# Patient Record
Sex: Female | Born: 1940 | Race: Black or African American | Hispanic: No | Marital: Married | Smoking: Former smoker
Health system: Southern US, Community
[De-identification: ages and names within clinical notes are randomized; demographics above are authoritative.]

## PROBLEM LIST (undated history)

## (undated) DIAGNOSIS — E119 Type 2 diabetes mellitus without complications: Secondary | ICD-10-CM

## (undated) DIAGNOSIS — IMO0001 Reserved for inherently not codable concepts without codable children: Secondary | ICD-10-CM

## (undated) DIAGNOSIS — I10 Essential (primary) hypertension: Secondary | ICD-10-CM

## (undated) DIAGNOSIS — K219 Gastro-esophageal reflux disease without esophagitis: Secondary | ICD-10-CM

## (undated) DIAGNOSIS — J449 Chronic obstructive pulmonary disease, unspecified: Secondary | ICD-10-CM

## (undated) DIAGNOSIS — J45909 Unspecified asthma, uncomplicated: Secondary | ICD-10-CM

## (undated) DIAGNOSIS — D649 Anemia, unspecified: Secondary | ICD-10-CM

## (undated) DIAGNOSIS — F329 Major depressive disorder, single episode, unspecified: Secondary | ICD-10-CM

## (undated) DIAGNOSIS — E785 Hyperlipidemia, unspecified: Secondary | ICD-10-CM

## (undated) DIAGNOSIS — R32 Unspecified urinary incontinence: Secondary | ICD-10-CM

## (undated) HISTORY — DX: Type 2 diabetes mellitus without complications: E11.9

## (undated) HISTORY — DX: Reserved for inherently not codable concepts without codable children: IMO0001

## (undated) HISTORY — DX: Essential (primary) hypertension: I10

## (undated) HISTORY — DX: Gastro-esophageal reflux disease without esophagitis: K21.9

## (undated) HISTORY — DX: Anemia, unspecified: D64.9

## (undated) HISTORY — DX: Unspecified urinary incontinence: R32

## (undated) HISTORY — PX: BREAST BIOPSY: SHX20

## (undated) HISTORY — DX: Major depressive disorder, single episode, unspecified: F32.9

## (undated) HISTORY — DX: Chronic obstructive pulmonary disease, unspecified: J44.9

## (undated) HISTORY — DX: Hyperlipidemia, unspecified: E78.5

---

## 1967-07-08 HISTORY — PX: BLADDER SURGERY: SHX569

## 1971-07-08 HISTORY — PX: BREAST SURGERY: SHX581

## 2007-07-14 ENCOUNTER — Ambulatory Visit: Payer: Self-pay | Admitting: Internal Medicine

## 2007-10-08 ENCOUNTER — Ambulatory Visit: Payer: Self-pay | Admitting: Unknown Physician Specialty

## 2008-08-15 ENCOUNTER — Ambulatory Visit: Payer: Self-pay | Admitting: Internal Medicine

## 2009-12-13 ENCOUNTER — Ambulatory Visit: Payer: Self-pay | Admitting: Family Medicine

## 2015-06-20 ENCOUNTER — Encounter: Payer: Self-pay | Admitting: Obstetrics and Gynecology

## 2015-06-20 ENCOUNTER — Ambulatory Visit (INDEPENDENT_AMBULATORY_CARE_PROVIDER_SITE_OTHER): Payer: Medicare Other | Admitting: Obstetrics and Gynecology

## 2015-06-20 VITALS — BP 144/81 | HR 106 | Resp 16 | Ht 65.0 in | Wt 154.6 lb

## 2015-06-20 DIAGNOSIS — J449 Chronic obstructive pulmonary disease, unspecified: Secondary | ICD-10-CM | POA: Insufficient documentation

## 2015-06-20 DIAGNOSIS — E119 Type 2 diabetes mellitus without complications: Secondary | ICD-10-CM | POA: Diagnosis not present

## 2015-06-20 DIAGNOSIS — D649 Anemia, unspecified: Secondary | ICD-10-CM | POA: Insufficient documentation

## 2015-06-20 DIAGNOSIS — R310 Gross hematuria: Secondary | ICD-10-CM | POA: Diagnosis not present

## 2015-06-20 DIAGNOSIS — R32 Unspecified urinary incontinence: Secondary | ICD-10-CM | POA: Insufficient documentation

## 2015-06-20 DIAGNOSIS — K219 Gastro-esophageal reflux disease without esophagitis: Secondary | ICD-10-CM

## 2015-06-20 DIAGNOSIS — IMO0001 Reserved for inherently not codable concepts without codable children: Secondary | ICD-10-CM | POA: Insufficient documentation

## 2015-06-20 DIAGNOSIS — E785 Hyperlipidemia, unspecified: Secondary | ICD-10-CM | POA: Insufficient documentation

## 2015-06-20 DIAGNOSIS — F329 Major depressive disorder, single episode, unspecified: Secondary | ICD-10-CM | POA: Insufficient documentation

## 2015-06-20 DIAGNOSIS — I10 Essential (primary) hypertension: Secondary | ICD-10-CM | POA: Insufficient documentation

## 2015-06-20 LAB — URINALYSIS, COMPLETE
Bilirubin, UA: NEGATIVE
GLUCOSE, UA: NEGATIVE
Ketones, UA: NEGATIVE
NITRITE UA: NEGATIVE
PROTEIN UA: NEGATIVE
RBC, UA: NEGATIVE
Specific Gravity, UA: 1.02 (ref 1.005–1.030)
Urobilinogen, Ur: 0.2 mg/dL (ref 0.2–1.0)
pH, UA: 5 (ref 5.0–7.5)

## 2015-06-20 LAB — BLADDER SCAN AMB NON-IMAGING

## 2015-06-20 LAB — MICROSCOPIC EXAMINATION: RBC, UA: NONE SEEN /hpf (ref 0–?)

## 2015-06-20 NOTE — Progress Notes (Signed)
06/20/2015 11:48 AM   Shelly Parks 04/05/1941 [id number]  Referring provider: No referring provider defined for this encounter.  Chief Complaint  Patient presents with  . Urinary Incontinence  . Establish Care    HPI: Patient is a 74 year old female presenting today as a referral from her primary care provider with complaints of urinary frequency, nocturia, occasional urge incontinence, weak stream, difficulty urinating and the patient report of one episode of gross hematuria. No exacerbating or alleviating factors.  Patient is a very pleasant female but does seem to have some baseline memory issues. She states that she cannot recall how long she has been experiencing her urinary symptoms. She does state that she saw blood in her urine but is not quite sure how many times. She states that it occurred a few weeks ago and it has since cleared.  She reports her most bothersome symptom is nocturia with 4-5 voids per night.  She denies any history of renal stones.  Last Cr 0.92  She is a diabetic but per her referral notes it seems that her blood glucoses well controlled.   PMH: Past Medical History  Diagnosis Date  . HLD (hyperlipidemia)   . HTN (hypertension), benign   . Anemia   . Major depressive disorder, single episode, unspecified (HCC)   . Reflux   . COPD (chronic obstructive pulmonary disease) (HCC)   . Incontinence of urine in female   . Diabetes mellitus without complication [facility](HCC)     Surgical History: Past Surgical History  Procedure Laterality Date  . Breast surgery  1973  . Bladder surgery  1969    Home Medications:    Medication List       This list is accurate as of: 06/20/15 11:48 AM.  Always use your most recent med list.               ADVAIR DISKUS 100-50 MCG/DOSE Aepb  Generic drug:  Fluticasone-Salmeterol  Inhale 1 puff into the lungs 2 (two) times daily.     albuterol 108 (90 BASE) MCG/ACT inhaler  Commonly known as:  PROVENTIL  HFA;VENTOLIN HFA  Inhale into the lungs every 6 (six) hours as needed for wheezing or shortness of breath.     amLODipine 10 MG tablet  Commonly known as:  NORVASC  Take 10 mg by mouth daily.     CALCIUM 500 PO  Take 1 tablet by mouth.     hydrochlorothiazide 25 MG tablet  Commonly known as:  HYDRODIURIL  Take 25 mg by mouth daily.     lisinopril 40 MG tablet  Commonly known as:  PRINIVIL,ZESTRIL  Take 40 mg by mouth daily.     omeprazole 40 MG capsule  Commonly known as:  PRILOSEC  Take 40 mg by mouth daily.     pravastatin 80 MG tablet  Commonly known as:  PRAVACHOL  Take 80 mg by mouth daily.     venlafaxine XR 37.5 MG 24 hr capsule  Commonly known as:  EFFEXOR-XR  Take 37.5 mg by mouth daily with breakfast.     VITRON-C PO  Take 1 tablet by mouth.        Allergies: No Known Allergies  Family History: Family History  Problem Relation Age of Onset  . Cancer Father   . Hypertension Mother   . Cancer Brother   . Hypertension Sister   . Bladder Cancer Sister     Social History:  reports that she quit smoking about 45 years ago. She  does not have any smokeless tobacco history on file. She reports that she does not drink alcohol or use illicit drugs.  ROS: UROLOGY Frequent Urination?: Yes Hard to postpone urination?: No Burning/pain with urination?: No Get up at night to urinate?: Yes Leakage of urine?: Yes Urine stream starts and stops?: Yes Trouble starting stream?: Yes Do you have to strain to urinate?: Yes Blood in urine?: Yes Urinary tract infection?: No Sexually transmitted disease?: No Injury to kidneys or bladder?: No Painful intercourse?: No Weak stream?: Yes Currently pregnant?: No Vaginal bleeding?: No Last menstrual period?: n/a  Gastrointestinal Nausea?: Yes Vomiting?: No Indigestion/heartburn?: Yes Diarrhea?: No Constipation?: Yes  Constitutional Fever: No Night sweats?: Yes Weight loss?: Yes Fatigue?: Yes  Skin Skin  rash/lesions?: Yes Itching?: Yes  Eyes Blurred vision?: Yes Double vision?: No  Ears/Nose/Throat Sore throat?: Yes Sinus problems?: Yes  Hematologic/Lymphatic Swollen glands?: No Easy bruising?: Yes  Cardiovascular Leg swelling?: Yes Chest pain?: Yes  Respiratory Cough?: Yes Shortness of breath?: Yes  Endocrine Excessive thirst?: Yes  Musculoskeletal Back pain?: Yes Joint pain?: Yes  Neurological Headaches?: No Dizziness?: Yes  Psychologic Depression?: No Anxiety?: Yes  Physical Exam: BP 144/81 mmHg  Pulse 106  Resp 16  Ht  (1.651 m)  Wt 154 lb 9.6 oz (70.126 kg)  BMI 25.73 kg/m2  Constitutional:  Alert and oriented, No acute distress. HEENT: [place] AT, moist mucus membranes.  Trachea midline, no masses. Cardiovascular: No clubbing, cyanosis, or edema. Respiratory: Normal respiratory effort, no increased work of breathing. GI: Abdomen is soft, nontender, nondistended, no abdominal masses GU: No CVA tenderness.  Normal external genitalia, Mild vaginal atrophy, cervix and uterus surgically absent, normal urethral meatus, grade 1 cystocele, no SUI with Valsalva Skin: No rashes, bruises or suspicious lesions. Lymph: No cervical or inguinal adenopathy. Neurologic: Grossly intact, no focal deficits, moving all 4 extremities. Psychiatric: Normal mood and affect.  Laboratory Data:   Urinalysis Results for orders placed or performed in visit on 06/20/15  BLADDER SCAN AMB NON-IMAGING  Result Value Ref Range   Scan Result 27 mL     Pertinent Imaging:   Assessment & Plan:   1. Incontinence, urge- Minimal PVR 27 mLs. Patient provided samples of Myrbetriq 25 mg 1 tab daily to try for treatment of her urinary frequency and nocturia while hematuria workup pending.  - Urinalysis, Complete - BLADDER SCAN AMB NON-IMAGING  2. Gross Hematuria-  UA unremarkable today. Patient recalls at least one episode of gross hematuria which she states has cleared now. We  discussed the differential diagnosis for microscopic hematuria including nephrolithiasis, renal or upper tract tumors, bladder stones, UTIs, or bladder tumors as well as undetermined etiologies. Per AUA guidelines, I did recommend complete microscopic hematuria evaluation including CTU, possible urine cytology, and office cystoscopy. -CT Urogram -cystoscopy  3. Diabetes mellitus without complication (HCC)-well-controlled continue management by PCP.   Return for CT URgram results and cystoscopy.  These notes generated with voice recognition software. I apologize for typographical errors.  Earlie Lou, FNP  [facility] [address] [place], [place] [address] [phone]

## 2015-06-20 NOTE — Patient Instructions (Addendum)
You mentioned today that you have seen blood in your urine.  This is called gross hematuria.  We need to perform tests including a CT scan and a cystoscopy.  Someone will be calling you to schedule your CT scan.  Your cystoscopy will be performed here at our office and we will also review your CT results with you at that time.  You were provided samples of Myrbetriq today to help with your urinary frequency.  Please take one tablet daily.  Notify our office if you experience any difficulty urinating.  Hematuria, Adult Hematuria is blood in your urine. It can be caused by a bladder infection, kidney infection, prostate infection, kidney stone, or cancer of your urinary tract. Infections can usually be treated with medicine, and a kidney stone usually will pass through your urine. If neither of these is the cause of your hematuria, further workup to find out the reason may be needed. It is very important that you tell your health care provider about any blood you see in your urine, even if the blood stops without treatment or happens without causing pain. Blood in your urine that happens and then stops and then happens again can be a symptom of a very serious condition. Also, pain is not a symptom in the initial stages of many urinary cancers. HOME CARE INSTRUCTIONS   Drink lots of fluid, 3-4 quarts a day. If you have been diagnosed with an infection, cranberry juice is especially recommended, in addition to large amounts of water.  Avoid caffeine, tea, and carbonated beverages because they tend to irritate the bladder.  Avoid alcohol because it may irritate the prostate.  Take all medicines as directed by your health care provider.  If you were prescribed an antibiotic medicine, finish it all even if you start to feel better.  If you have been diagnosed with a kidney stone, follow your health care provider's instructions regarding straining your urine to catch the stone.  Empty your bladder  often. Avoid holding urine for long periods of time.  After a bowel movement, women should cleanse front to back. Use each tissue only once.  Empty your bladder before and after sexual intercourse if you are a female. SEEK MEDICAL CARE IF:  You develop back pain.  You have a fever.  You have a feeling of sickness in your stomach (nausea) or vomiting.  Your symptoms are not better in 3 days. Return sooner if you are getting worse. SEEK IMMEDIATE MEDICAL CARE IF:   You develop severe vomiting and are unable to keep the medicine down.  You develop severe back or abdominal pain despite taking your medicines.  You begin passing a large amount of blood or clots in your urine.  You feel extremely weak or faint, or you pass out. MAKE SURE YOU:   Understand these instructions.  Will watch your condition.  Will get help right away if you are not doing well or get worse.   This information is not intended to replace advice given to you by your health care provider. Make sure you discuss any questions you have with your health care provider.   Document Released: 06/23/2005 Document Revised: 07/14/2014 Document Reviewed: 02/21/2013 Elsevier Interactive Patient Education [employer] Cystoscopy Cystoscopy is a procedure that is used to help your caregiver diagnose and sometimes treat conditions that affect your lower urinary tract. Your lower urinary tract includes your bladder and the tube through which urine passes from your bladder out of your body (  urethra). Cystoscopy is performed with a thin, tube-shaped instrument (cystoscope). The cystoscope has lenses and a light at the end so that your caregiver can see inside your bladder. The cystoscope is inserted at the entrance of your urethra. Your caregiver guides it through your urethra and into your bladder. There are two main types of cystoscopy:  Flexible cystoscopy (with a flexible cystoscope).  Rigid cystoscopy (with a rigid  cystoscope). Cystoscopy may be recommended for many conditions, including:  Urinary tract infections.  Blood in your urine (hematuria).  Loss of bladder control (urinary incontinence) or overactive bladder.  Unusual cells found in a urine sample.  Urinary blockage.  Painful urination. Cystoscopy may also be done to remove a sample of your tissue to be checked under a microscope (biopsy). It may also be done to remove or destroy bladder stones. LET YOUR CAREGIVER KNOW ABOUT:  Allergies to food or medicine.  Medicines taken, including vitamins, herbs, eyedrops, over-the-counter medicines, and creams.  Use of steroids (by mouth or creams).  Previous problems with anesthetics or numbing medicines.  History of bleeding problems or blood clots.  Previous surgery.  Other health problems, including diabetes and kidney problems.  Possibility of pregnancy, if this applies. PROCEDURE The area around the opening to your urethra will be cleaned. A medicine to numb your urethra (local anesthetic) is used. If a tissue sample or stone is removed during the procedure, you may be given a medicine to make you sleep (general anesthetic). Your caregiver will gently insert the tip of the cystoscope into your urethra. The cystoscope will be slowly glided through your urethra and into your bladder. Sterile fluid will flow through the cystoscope and into your bladder. The fluid will expand and stretch your bladder. This gives your caregiver a better view of your bladder walls. The procedure lasts about 15-20 minutes. AFTER THE PROCEDURE If a local anesthetic is used, you will be allowed to go home as soon as you are ready. If a general anesthetic is used, you will be taken to a recovery area until you are stable. You may have temporary bleeding and burning on urination.   This information is not intended to replace advice given to you by your health care provider. Make sure you discuss any questions you  have with your health care provider.   Document Released: 06/20/2000 Document Revised: 07/14/2014 Document Reviewed: 12/15/2011 Elsevier Interactive Patient Education [employer]

## 2015-06-29 ENCOUNTER — Ambulatory Visit
Admission: RE | Admit: 2015-06-29 | Discharge: 2015-06-29 | Disposition: A | Discharge status: Home / Self Care | Payer: Medicare Other | Source: Ambulatory Visit | Attending: Obstetrics and Gynecology | Admitting: Obstetrics and Gynecology

## 2015-06-29 DIAGNOSIS — R310 Gross hematuria: Secondary | ICD-10-CM | POA: Diagnosis not present

## 2015-06-29 MED ORDER — IOHEXOL 300 MG/ML  SOLN
125.0000 mL | Freq: Once | INTRAMUSCULAR | Status: AC | PRN
  Administered 2015-06-29: 125 mL via INTRAVENOUS

## 2015-07-04 ENCOUNTER — Encounter: Payer: Self-pay | Admitting: Urology

## 2015-07-04 ENCOUNTER — Ambulatory Visit: Payer: Medicare Other | Admitting: Obstetrics and Gynecology

## 2015-07-04 ENCOUNTER — Ambulatory Visit (INDEPENDENT_AMBULATORY_CARE_PROVIDER_SITE_OTHER): Payer: Medicare Other | Admitting: Urology

## 2015-07-04 VITALS — BP 145/82 | HR 88 | Ht 64.0 in | Wt 153.9 lb

## 2015-07-04 DIAGNOSIS — R310 Gross hematuria: Secondary | ICD-10-CM

## 2015-07-04 DIAGNOSIS — N3941 Urge incontinence: Secondary | ICD-10-CM | POA: Diagnosis not present

## 2015-07-04 LAB — URINALYSIS, COMPLETE
Bilirubin, UA: NEGATIVE
GLUCOSE, UA: NEGATIVE
KETONES UA: NEGATIVE
NITRITE UA: NEGATIVE
Protein, UA: NEGATIVE
RBC, UA: NEGATIVE
Specific Gravity, UA: 1.015 (ref 1.005–1.030)
UUROB: 0.2 mg/dL (ref 0.2–1.0)
pH, UA: 7 (ref 5.0–7.5)

## 2015-07-04 LAB — MICROSCOPIC EXAMINATION: Renal Epithel, UA: NONE SEEN /hpf

## 2015-07-04 MED ORDER — CIPROFLOXACIN HCL 500 MG PO TABS
500.0000 mg | ORAL_TABLET | Freq: Once | ORAL | Status: AC
  Administered 2015-07-04: 500 mg via ORAL

## 2015-07-04 MED ORDER — LIDOCAINE HCL 2 % EX GEL
1.0000 "application " | Freq: Once | CUTANEOUS | Status: AC
  Administered 2015-07-04: 1 via URETHRAL

## 2015-07-04 MED ORDER — MIRABEGRON ER 25 MG PO TB24
25.0000 mg | ORAL_TABLET | Freq: Every day | ORAL | Status: DC
Start: 2015-07-04 — End: 2016-10-30

## 2015-07-04 NOTE — Progress Notes (Signed)
07/04/2015 3:34 PM   Shelly Parks 06-Mar-1941 [id number]  Referring provider: No referring provider defined for this encounter.  Chief Complaint  Patient presents with  . Cysto    hematuria    HPI: Patient is a 73 year old female presenting today as a referral from her primary care provider with complaints of urinary frequency, nocturia, occasional urge incontinence, weak stream, difficulty urinating and the patient report of one episode of gross hematuria. No exacerbating or alleviating factors.  Patient is a very pleasant female but does seem to have some baseline memory issues. She states that she cannot recall how long she has been experiencing her urinary symptoms. She does state that she saw blood in her urine but is not quite sure how many times. She states that it occurred a few weeks ago and it has since cleared.  She reports her most bothersome symptom is nocturia with 4-5 voids per night.  She denies any history of renal stones.  Last Cr 0.92  She is a diabetic but per her referral notes it seems that her blood glucoses well controlled.   Interval History: The patient returns for completion of her hematuria workup. CT urogram was unremarkable. She presents for cystoscopy today.   In regards to her urge incontinence, the patient feels much better. Her nocturia is now minimal. She denies any current daytime frequency. She has had no more episodes of urge incontinence. She is very happy with the Myrbetriq.   PMH: Past Medical History  Diagnosis Date  . HLD (hyperlipidemia)   . HTN (hypertension), benign   . Anemia   . Major depressive disorder, single episode, unspecified (HCC)   . Reflux   . COPD (chronic obstructive pulmonary disease) (HCC)   . Incontinence of urine in female   . Diabetes mellitus without complication [facility])     Surgical History: Past Surgical History  Procedure Laterality Date  . Breast surgery  1973  . Bladder surgery  1969    Home  Medications:    Medication List       This list is accurate as of: 07/04/15  3:34 PM.  Always use your most recent med list.               ADVAIR DISKUS 100-50 MCG/DOSE Aepb  Generic drug:  Fluticasone-Salmeterol  Inhale 1 puff into the lungs 2 (two) times daily.     albuterol 108 (90 Base) MCG/ACT inhaler  Commonly known as:  PROVENTIL HFA;VENTOLIN HFA  Inhale into the lungs every 6 (six) hours as needed for wheezing or shortness of breath.     amLODipine 10 MG tablet  Commonly known as:  NORVASC  Take 10 mg by mouth daily.     CALCIUM 500 PO  Take 1 tablet by mouth.     hydrochlorothiazide 25 MG tablet  Commonly known as:  HYDRODIURIL  Take 25 mg by mouth daily.     lisinopril 40 MG tablet  Commonly known as:  PRINIVIL,ZESTRIL  Take 40 mg by mouth daily.     MYRBETRIQ 25 MG Tb24 tablet  Generic drug:  mirabegron ER  Take 25 mg by mouth daily.     omeprazole 40 MG capsule  Commonly known as:  PRILOSEC  Take 40 mg by mouth daily.     pravastatin 80 MG tablet  Commonly known as:  PRAVACHOL  Take 80 mg by mouth daily.     venlafaxine XR 37.5 MG 24 hr capsule  Commonly known as:  EFFEXOR-XR  Take 37.5 mg by mouth daily with breakfast.     VITRON-C PO  Take 1 tablet by mouth.        Allergies: No Known Allergies  Family History: Family History  Problem Relation Age of Onset  . Cancer Father   . Hypertension Mother   . Cancer Brother   . Hypertension Sister   . Bladder Cancer Sister     Social History:  reports that she quit smoking about 45 years ago. She does not have any smokeless tobacco history on file. She reports that she does not drink alcohol or use illicit drugs.  ROS:                                        Physical Exam: BP 145/82 mmHg  Pulse 88  Ht 5\' 4"  (1.626 m)  Wt 153 lb 14.4 oz (69.809 kg)  BMI 26.40 kg/m2  Constitutional:  Alert and oriented, No acute distress. HEENT: [place] AT, moist mucus membranes.   Trachea midline, no masses. Cardiovascular: No clubbing, cyanosis, or edema. Respiratory: Normal respiratory effort, no increased work of breathing. GI: Abdomen is soft, nontender, nondistended, no abdominal masses GU: No CVA tenderness.  Skin: No rashes, bruises or suspicious lesions. Lymph: No cervical or inguinal adenopathy. Neurologic: Grossly intact, no focal deficits, moving all 4 extremities. Psychiatric: Normal mood and affect.  Laboratory Data: No results found for: WBC, HGB, HCT, MCV, PLT  No results found for: CREATININE  No results found for: PSA  No results found for: TESTOSTERONE  No results found for: HGBA1C  Urinalysis    Component Value Date/Time   GLUCOSEU Negative 06/20/2015 0955   BILIRUBINUR Negative 06/20/2015 0955   NITRITE Negative 06/20/2015 0955   LEUKOCYTESUR Trace* 06/20/2015 0955    Pertinent Imaging:  Study Result     CLINICAL DATA: Painless hematuria for 1 month.  EXAM: CT ABDOMEN AND PELVIS WITHOUT AND WITH CONTRAST  TECHNIQUE: Multidetector CT imaging of the abdomen and pelvis was performed following the standard protocol before and following the bolus administration of intravenous contrast.  CONTRAST: 125mL OMNIPAQUE IOHEXOL 300 MG/ML SOLN  COMPARISON: None.  FINDINGS: Lower chest: Lung bases are clear.  Hepatobiliary: No focal hepatic lesion. No biliary duct dilatation. Gallbladder is normal. Common bile duct is normal.  Pancreas: Pancreas is normal. No ductal dilatation. No pancreatic inflammation.  Spleen: Normal spleen  Adrenals/urinary tract: Adrenal glands are normal.  Non IV contrast images demonstrate no nephrolithiasis or ureterolithiasis. Cortical phase imaging demonstrates no enhancing renal cortical lesion. Nonenhancing cyst lower pole of the LEFT kidney measures 16 mm. Delayed pyelogram phase imaging demonstrates no filling defects within the collecting systems or ureters. There is a mild  bulbous distention of the most distal RIGHT ureter (image 73, series 12).  No bladder calculi, filling defect within the bladder, or enhancing bladder lesion.  Stomach/Bowel: Stomach, small bowel, appendix, and cecum are normal. The colon and rectosigmoid colon are normal.  Vascular/Lymphatic: Abdominal aorta is normal caliber. There is no retroperitoneal or periportal lymphadenopathy. No pelvic lymphadenopathy.  Reproductive: Post hysterectomy anatomy.  Other: No free fluid.  Musculoskeletal: Some degenerate sclerosis along the SI joints. No aggressive osseous lesion.  IMPRESSION: 1. No explanation for hematuria. No nephrolithiasis, ureterolithiasis, enhancing renal cortical lesion, or filling defects within the collecting systems. 2. 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 3. Potential small ureterocele of the distal RIGHT  ureter. 4. No bladder stones or filling defects in the bladder which does not excluded a bladder lesion.    Cystoscopy Procedure Note  Patient identification was confirmed, informed consent was obtained, and patient was prepped using Betadine solution.  Lidocaine jelly was administered per urethral meatus.    Preoperative abx where received prior to procedure.    Procedure: - Flexible cystoscope introduced, without any difficulty.   - Thorough search of the bladder revealed:    normal urethral meatus    normal urothelium    no stones    no ulcers     no tumors    no urethral polyps    no trabeculation  - Ureteral orifices were normal in position and appearance.  Post-Procedure: - Patient tolerated the procedure well   Assessment & Plan:    1. Gross hematuria Benign work up. Repeat urinalysis for one year  2. Left Bosniak 1 renal cyst Benign lesion. No further work up required.  3.  Urge incontinence Continue Myrbetriq 25 mg daily. Follow up in 3 months  Return in about 3 months (around 10/02/2015).  Hildred Laser,  MD  [facility] [address] [place], [place] [address] [phone]

## 2015-10-03 ENCOUNTER — Ambulatory Visit: Payer: Medicare Other

## 2015-10-04 ENCOUNTER — Encounter: Payer: Self-pay | Admitting: Urology

## 2015-10-04 ENCOUNTER — Ambulatory Visit (INDEPENDENT_AMBULATORY_CARE_PROVIDER_SITE_OTHER): Payer: Medicare Other | Admitting: Urology

## 2015-10-04 VITALS — BP 159/80 | HR 103 | Ht 64.0 in | Wt 158.0 lb

## 2015-10-04 DIAGNOSIS — R310 Gross hematuria: Secondary | ICD-10-CM | POA: Diagnosis not present

## 2015-10-04 DIAGNOSIS — R32 Unspecified urinary incontinence: Secondary | ICD-10-CM | POA: Diagnosis not present

## 2015-10-04 LAB — URINALYSIS, COMPLETE
BILIRUBIN UA: NEGATIVE
GLUCOSE, UA: NEGATIVE
KETONES UA: NEGATIVE
NITRITE UA: NEGATIVE
Protein, UA: NEGATIVE
RBC UA: NEGATIVE
SPEC GRAV UA: 1.02 (ref 1.005–1.030)
Urobilinogen, Ur: 0.2 mg/dL (ref 0.2–1.0)
pH, UA: 5.5 (ref 5.0–7.5)

## 2015-10-04 LAB — MICROSCOPIC EXAMINATION
Epithelial Cells (non renal): >10 /hpf — ABNORMAL HIGH (ref 0–10)
RBC, UA: NONE SEEN /hpf (ref 0–?)

## 2015-10-04 LAB — BLADDER SCAN AMB NON-IMAGING: Scan Result: 0

## 2015-10-04 MED ORDER — MIRABEGRON ER 25 MG PO TB24: 25.0000 mg | ORAL_TABLET | Freq: Every day | ORAL | Status: DC

## 2015-10-04 NOTE — Progress Notes (Signed)
Bladder Scan °Patient  void: 0 ml °Performed By:K.Bricen Victory,CMA °

## 2015-10-04 NOTE — Progress Notes (Signed)
10/04/2015 11:35 AM   Shelly MantisIrene H Parks 06/08/1941 [id number]  Referring provider: No referring provider defined for this encounter.  Chief Complaint  Patient presents with  . Follow-up    urge incontinence    HPI: Patient is a 75 year old female presenting today as a referral from her primary care provider with complaints of urinary frequency, nocturia, occasional urge incontinence, weak stream, difficulty urinating and the patient report of one episode of gross hematuria. No exacerbating or alleviating factors.  Patient is a very pleasant female but does seem to have some baseline memory issues. She states that she cannot recall how long she has been experiencing her urinary symptoms. She does state that she saw blood in her urine but is not quite sure how many times. She states that it occurred a few weeks ago and it has since cleared.  She reports her most bothersome symptom is nocturia with 4-5 voids per night.  She denies any history of renal stones.  Last Cr 0.92  She is a diabetic but per her referral notes it seems that her blood glucoses well controlled.   The patient also under a gross hematuria workup with negative cystoscopy and CT urogram approximately 3 months ago.  In regards to her urge incontinence, the patient feels much better. Her nocturia is now minimal. She denies any current daytime frequency. She has had no more episodes of urge incontinence. She is very happy with the Myrbetriq. She is able to afford this medication with her insurance.   PVR: 0 cc  PMH: Past Medical History  Diagnosis Date  . HLD (hyperlipidemia)   . HTN (hypertension), benign   . Anemia   . Major depressive disorder, single episode, unspecified (HCC)   . Reflux   . COPD (chronic obstructive pulmonary disease) (HCC)   . Incontinence of urine in female   . Diabetes mellitus without complication [facility](HCC)     Surgical History: Past Surgical History  Procedure Laterality Date  .  Breast surgery  1973  . Bladder surgery  1969    Home Medications:    Medication List       This list is accurate as of: 10/04/15 11:35 AM.  Always use your most recent med list.               ADVAIR DISKUS 100-50 MCG/DOSE Aepb  Generic drug:  Fluticasone-Salmeterol  Inhale 1 puff into the lungs 2 (two) times daily.     albuterol 108 (90 Base) MCG/ACT inhaler  Commonly known as:  PROVENTIL HFA;VENTOLIN HFA  Inhale into the lungs every 6 (six) hours as needed for wheezing or shortness of breath.     amLODipine 10 MG tablet  Commonly known as:  NORVASC  Take 10 mg by mouth daily.     CALCIUM 500 PO  Take 1 tablet by mouth.     hydrochlorothiazide 25 MG tablet  Commonly known as:  HYDRODIURIL  Take 25 mg by mouth daily.     lisinopril 40 MG tablet  Commonly known as:  PRINIVIL,ZESTRIL  Take 40 mg by mouth daily.     MYRBETRIQ 25 MG Tb24 tablet  Generic drug:  mirabegron ER  Take 25 mg by mouth daily.     mirabegron ER 25 MG Tb24 tablet  Commonly known as:  MYRBETRIQ  Take 1 tablet (25 mg total) by mouth daily.     mirabegron ER 25 MG Tb24 tablet  Commonly known as:  MYRBETRIQ  Take 1 tablet (25 mg  total) by mouth daily.     omeprazole 40 MG capsule  Commonly known as:  PRILOSEC  Take 40 mg by mouth daily.     pravastatin 40 MG tablet  Commonly known as:  PRAVACHOL     venlafaxine XR 37.5 MG 24 hr capsule  Commonly known as:  EFFEXOR-XR  Take 37.5 mg by mouth daily with breakfast.     VITRON-C PO  Take 1 tablet by mouth.        Allergies: No Known Allergies  Family History: Family History  Problem Relation Age of Onset  . Cancer Father   . Hypertension Mother   . Cancer Brother   . Hypertension Sister   . Bladder Cancer Sister     Social History:  reports that she quit smoking about 45 years ago. She does not have any smokeless tobacco history on file. She reports that she does not drink alcohol or use illicit drugs.  ROS: UROLOGY Frequent  Urination?: Yes Hard to postpone urination?: Yes Burning/pain with urination?: No Get up at night to urinate?: Yes Leakage of urine?: No Urine stream starts and stops?: No Trouble starting stream?: No Do you have to strain to urinate?: No Blood in urine?: No Urinary tract infection?: No Sexually transmitted disease?: No Injury to kidneys or bladder?: No Painful intercourse?: No Weak stream?: No Currently pregnant?: No Vaginal bleeding?: No Last menstrual period?: No  Gastrointestinal Nausea?: No Vomiting?: No Indigestion/heartburn?: Yes Diarrhea?: No Constipation?: No  Constitutional Fever: No Night sweats?: No Weight loss?: No Fatigue?: Yes  Skin Skin rash/lesions?: No Itching?: No  Eyes Blurred vision?: No Double vision?: No  Ears/Nose/Throat Sore throat?: Yes Sinus problems?: No  Hematologic/Lymphatic Swollen glands?: No Easy bruising?: No  Cardiovascular Leg swelling?: No Chest pain?: No  Respiratory Cough?: Yes Shortness of breath?: No  Endocrine Excessive thirst?: No  Musculoskeletal Back pain?: Yes Joint pain?: No  Neurological Headaches?: No Dizziness?: No  Psychologic Depression?: No Anxiety?: Yes  Physical Exam: BP 159/80 mmHg  Pulse 103  Ht  (1.626 m)  Wt 158 lb (71.668 kg)  BMI 27.11 kg/m2  Constitutional:  Alert and oriented, No acute distress. HEENT: [place] AT, moist mucus membranes.  Trachea midline, no masses. Cardiovascular: No clubbing, cyanosis, or edema. Respiratory: Normal respiratory effort, no increased work of breathing. GI: Abdomen is soft, nontender, nondistended, no abdominal masses GU: No CVA tenderness.  Skin: No rashes, bruises or suspicious lesions. Lymph: No cervical or inguinal adenopathy. Neurologic: Grossly intact, no focal deficits, moving all 4 extremities. Psychiatric: Normal mood and affect.  Laboratory Data: No results found for: WBC, HGB, HCT, MCV, PLT  No results found for:  CREATININE  No results found for: PSA  No results found for: TESTOSTERONE  No results found for: HGBA1C  Urinalysis    Component Value Date/Time   APPEARANCEUR Clear 07/04/2015 1511   GLUCOSEU Negative 07/04/2015 1511   BILIRUBINUR Negative 07/04/2015 1511   PROTEINUR Negative 07/04/2015 1511   NITRITE Negative 07/04/2015 1511   LEUKOCYTESUR Trace* 07/04/2015 1511     Assessment & Plan:    1. Gross hematuria Benign work up. Repeat urinalysis for one year  2. Left Bosniak 1 renal cyst Benign lesion. No further work up required.  3. Urge incontinence Continue Myrbetriq 25 mg daily. Follow up in one year   Return in about 1 year (around 10/03/2016).  Hildred Laser, MD  [facility] [address], [address] [place], [place] [address] [phone]

## 2016-01-18 ENCOUNTER — Other Ambulatory Visit: Payer: Self-pay | Admitting: Physician Assistant

## 2016-01-18 DIAGNOSIS — Z1231 Encounter for screening mammogram for malignant neoplasm of breast: Secondary | ICD-10-CM

## 2016-02-08 ENCOUNTER — Other Ambulatory Visit: Payer: Self-pay | Admitting: Physician Assistant

## 2016-02-08 ENCOUNTER — Ambulatory Visit
Admission: RE | Admit: 2016-02-08 | Discharge: 2016-02-08 | Disposition: A | Discharge status: Home / Self Care | Payer: Medicare Other | Source: Ambulatory Visit | Attending: Physician Assistant | Admitting: Physician Assistant

## 2016-02-08 DIAGNOSIS — Z1231 Encounter for screening mammogram for malignant neoplasm of breast: Secondary | ICD-10-CM

## 2016-10-02 ENCOUNTER — Ambulatory Visit: Payer: Medicare Other | Admitting: Urology

## 2016-10-03 ENCOUNTER — Ambulatory Visit: Payer: Medicare Other

## 2016-10-20 IMAGING — CT CT ABD-PEL WO/W CM
3 of 10 series · 12 of 46 positions shown, 18 images · IV contrast (omnipaque)
Comparison: None.

CLINICAL DATA: Painless hematuria for 1 month.

EXAM:
CT ABDOMEN AND PELVIS WITHOUT AND WITH CONTRAST
TECHNIQUE: Multidetector CT imaging of the abdomen and pelvis was performed
following the standard protocol before and following the bolus
administration of intravenous contrast.
CONTRAST:  125mL OMNIPAQUE IOHEXOL 300 MG/ML  SOLN

[Series 5: cor hematuria > 45 wo · coronal · 0.69mm/px · 2 of 142 slices shown, 3 images]
[im 48/142  soft-tissue]
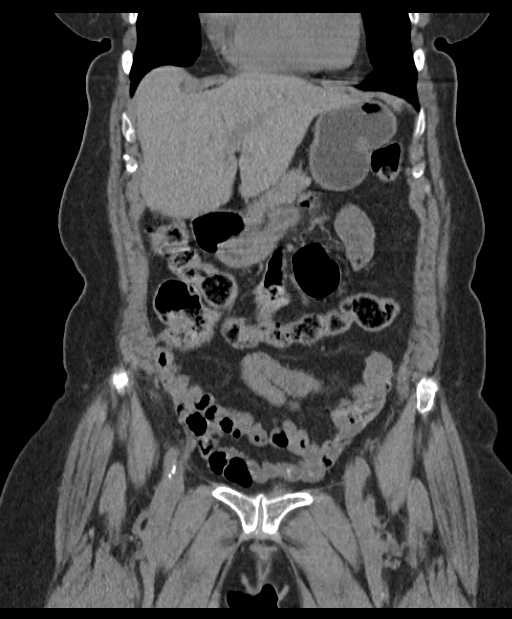
[im 48/142  bone]
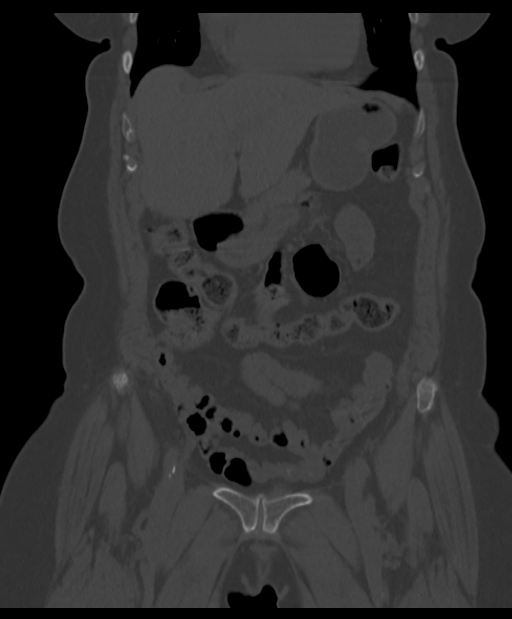
[im 95/142  soft-tissue]
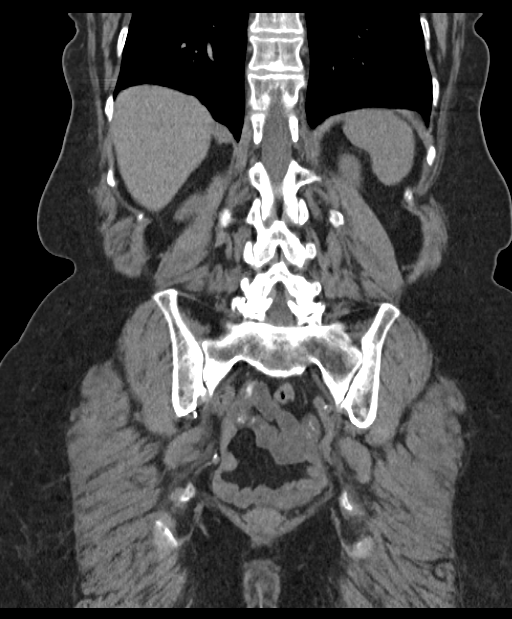

[Series 7: hematuria > 45 with · axial · 0.66mm/px · z∈[-841,-526]mm · 8 of 83 slices shown, 13 images]
[im 10/83  soft-tissue]
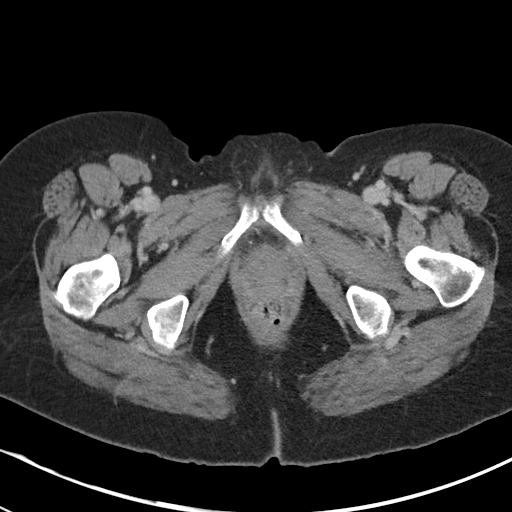
[im 10/83  bone]
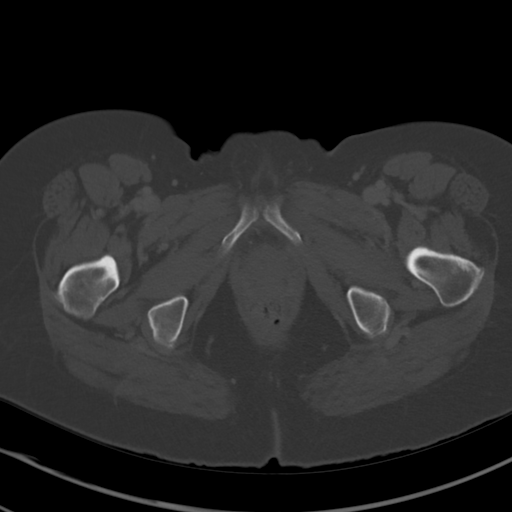
[im 19/83  soft-tissue]
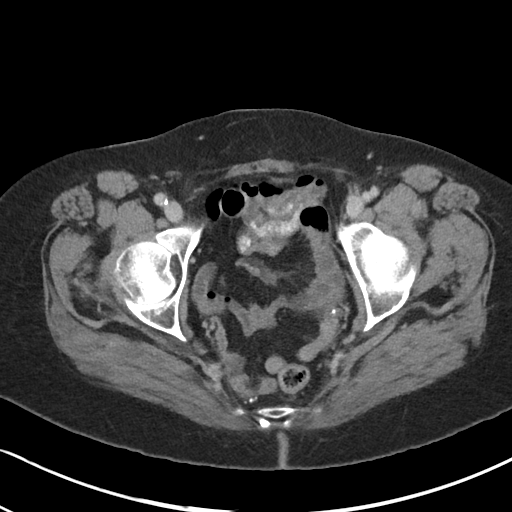
[im 28/83  soft-tissue]
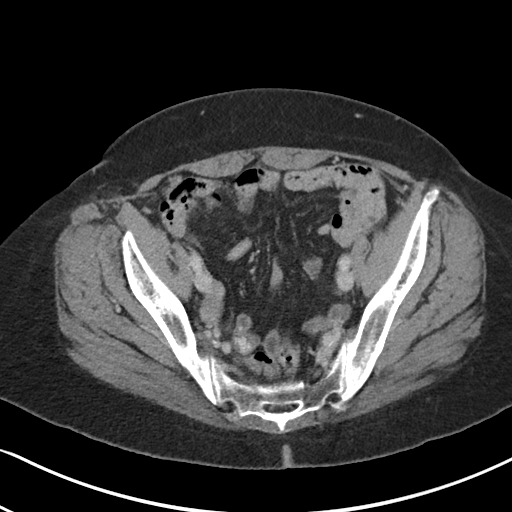
[im 37/83  soft-tissue]
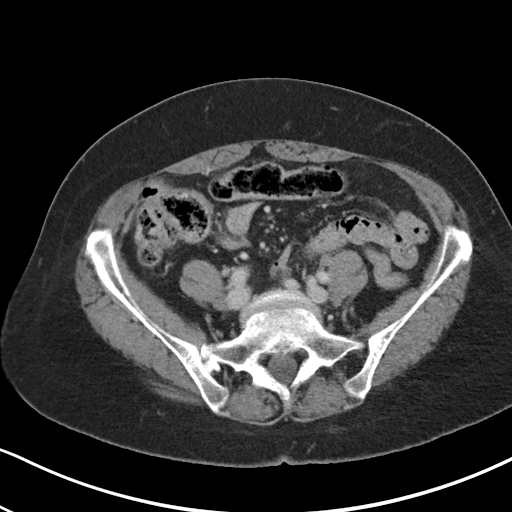
[im 46/83  soft-tissue]
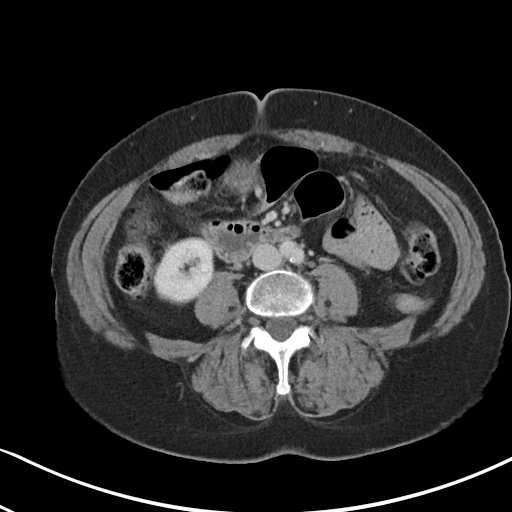
[im 46/83  lung]
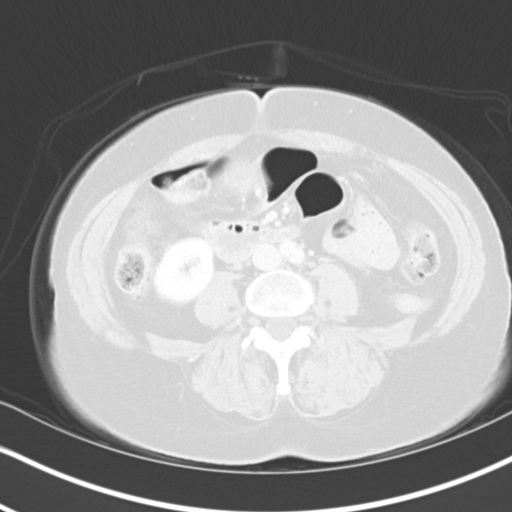
[im 55/83  soft-tissue]
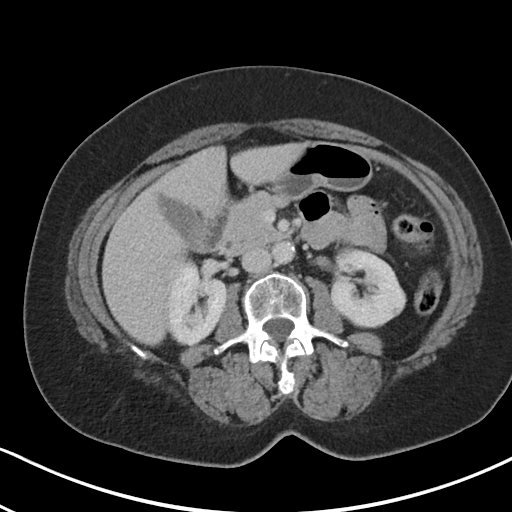
[im 55/83  lung]
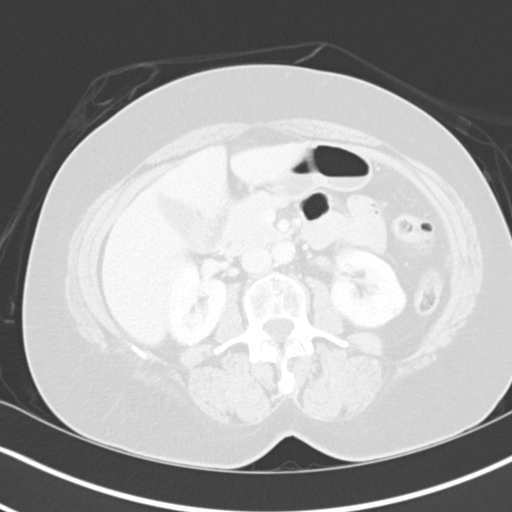
[im 64/83  soft-tissue]
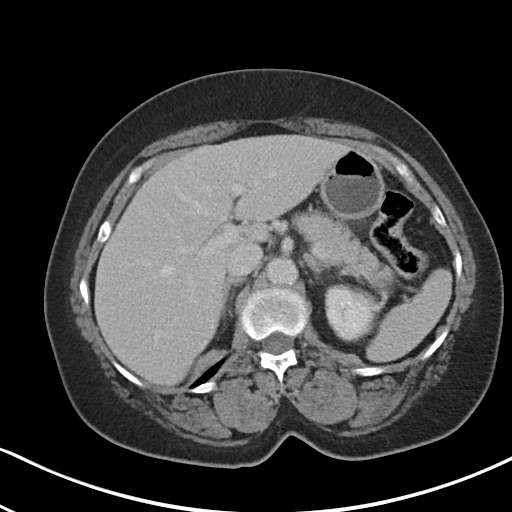
[im 64/83  lung]
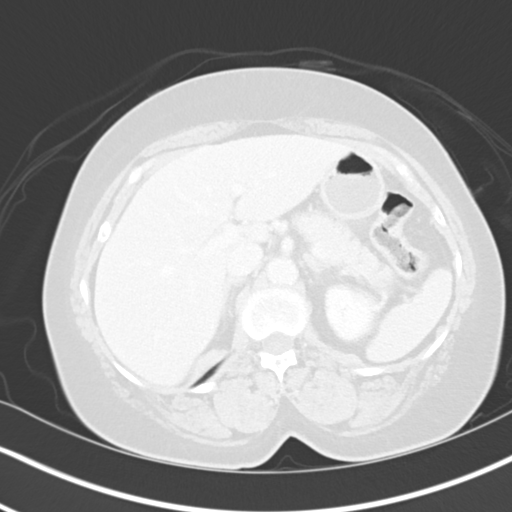
[im 73/83  soft-tissue]
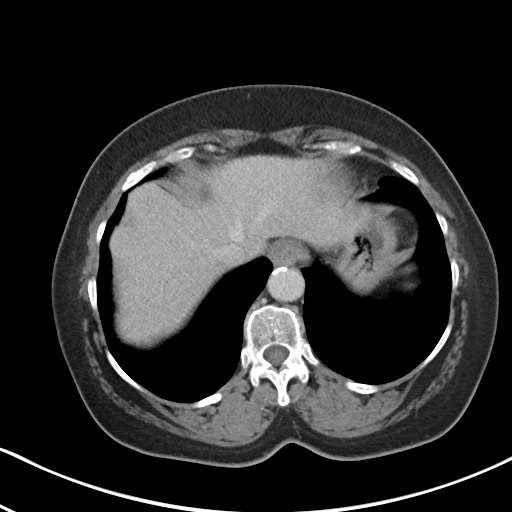
[im 73/83  lung]
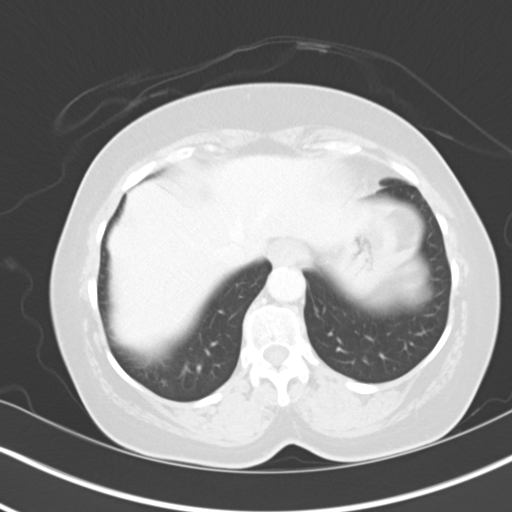

[Series 12: delay · axial · delayed · 0.66mm/px · z∈[-760,-715]mm · 2 of 85 slices shown]
[im 10/85  soft-tissue]
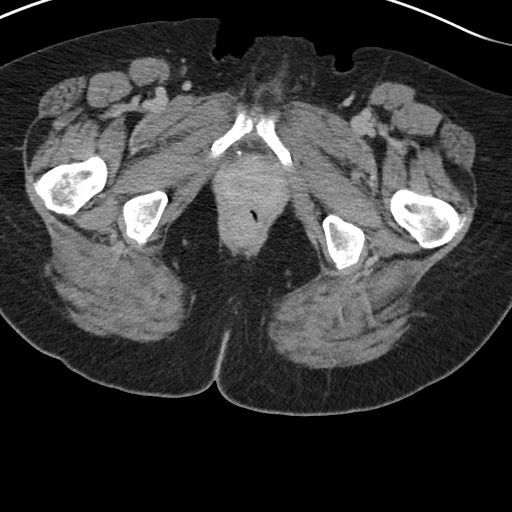
[im 19/85  soft-tissue]
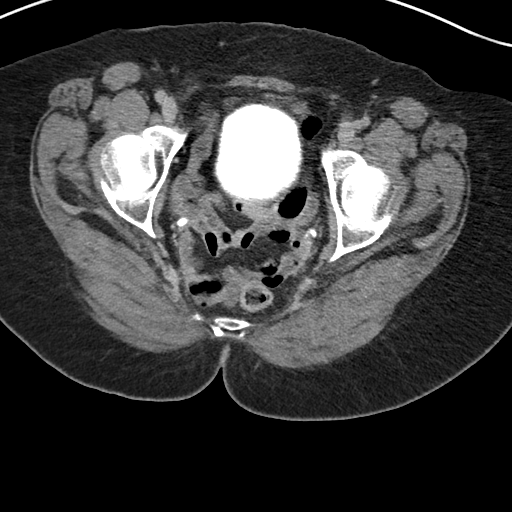

[12 of 46 positions shown; findings below may reference images not displayed]

FINDINGS: Lower chest: Lung bases are clear.

Hepatobiliary: No focal hepatic lesion. No biliary duct dilatation.
Gallbladder is normal. Common bile duct is normal.

Pancreas: Pancreas is normal. No ductal dilatation. No pancreatic
inflammation.

Spleen: Normal spleen

Adrenals/urinary tract: Adrenal glands are normal.

Non IV contrast images demonstrate no nephrolithiasis or
ureterolithiasis. Cortical phase imaging demonstrates no enhancing
renal cortical lesion. Nonenhancing cyst lower pole of the LEFT
kidney measures 16 mm. Delayed pyelogram phase imaging demonstrates
no filling defects within the collecting systems or ureters. There
is a mild bulbous distention of the most distal RIGHT ureter (image
73, series 12).

No bladder calculi, filling defect within the bladder, or enhancing
bladder lesion.

Stomach/Bowel: Stomach, small bowel, appendix, and cecum are normal.
The colon and rectosigmoid colon are normal.

Vascular/Lymphatic: Abdominal aorta is normal caliber. There is no
retroperitoneal or periportal lymphadenopathy. No pelvic
lymphadenopathy.

Reproductive: Post hysterectomy anatomy.

Other: No free fluid.

Musculoskeletal: Some degenerate sclerosis along the SI joints. No
aggressive osseous lesion.
IMPRESSION: 1. No explanation for hematuria. No nephrolithiasis,
ureterolithiasis, enhancing renal cortical lesion, or filling
defects within the collecting systems.
2. Bosniak 1 renal cysts of the LEFT kidney.
3. Potential small ureterocele of the distal RIGHT ureter.
4. No bladder stones or filling defects in the bladder which does
not excluded a bladder lesion.

## 2016-10-30 ENCOUNTER — Ambulatory Visit (INDEPENDENT_AMBULATORY_CARE_PROVIDER_SITE_OTHER): Payer: Medicare Other | Admitting: Urology

## 2016-10-30 ENCOUNTER — Encounter: Payer: Self-pay | Admitting: Urology

## 2016-10-30 VITALS — BP 170/74 | HR 108 | Ht 64.0 in | Wt 155.7 lb

## 2016-10-30 DIAGNOSIS — N281 Cyst of kidney, acquired: Secondary | ICD-10-CM

## 2016-10-30 DIAGNOSIS — N3941 Urge incontinence: Secondary | ICD-10-CM

## 2016-10-30 DIAGNOSIS — R310 Gross hematuria: Secondary | ICD-10-CM

## 2016-10-30 LAB — URINALYSIS, COMPLETE
BILIRUBIN UA: NEGATIVE
Glucose, UA: NEGATIVE
KETONES UA: NEGATIVE
Nitrite, UA: NEGATIVE
PH UA: 5 (ref 5.0–7.5)
PROTEIN UA: NEGATIVE
SPEC GRAV UA: 1.01 (ref 1.005–1.030)
UUROB: 0.2 mg/dL (ref 0.2–1.0)

## 2016-10-30 LAB — MICROSCOPIC EXAMINATION: RBC MICROSCOPIC, UA: NONE SEEN /HPF (ref 0–?)

## 2016-10-30 MED ORDER — MIRABEGRON ER 50 MG PO TB24: 50.0000 mg | ORAL_TABLET | Freq: Every day | ORAL | 11 refills | Status: DC

## 2016-10-30 NOTE — Progress Notes (Signed)
10/30/2016 1:49 PM   Shelly Parks [birth year] [id number]  Referring provider: No referring provider defined for this encounter.  Chief Complaint  Patient presents with  . Urinary Incontinence    HPI: The patient is a 76 year old female presents today for her yearly follow-up.  1. History of gross hematuria Benign work up in December 2016.  2. Left Bosniak 1 renal cyst Benign lesion. Found during hematuria work up. No further work up required.  3. Urge incontinence She is on Myrbetriq 25 mg daily. This does but good job of controlling her symptoms. Prior to starting this medication, she had significant daytime frequency and nocturia. She also had episodes of urge incontinence which have resolved with this medication. Since she was last seen, she says her nocturia has worsened and she has a 2-3 times per night. She is bothered by this. She still very happy though with her lack of urgency and urge incontinence.   PMH: Past Medical History:  Diagnosis Date  . Anemia   . COPD (chronic obstructive pulmonary disease) (HCC)   . Diabetes mellitus without complication (HCC)   . HLD (hyperlipidemia)   . HTN (hypertension), benign   . Incontinence of urine in female   . Major depressive disorder, single episode, unspecified   . Reflux     Surgical History: Past Surgical History:  Procedure Laterality Date  . BLADDER SURGERY  1969  . BREAST BIOPSY Right    long ago pt ? date  . BREAST SURGERY  1973    Home Medications:  Allergies as of 10/30/2016   No Known Allergies     Medication List       Accurate as of 10/30/16  1:49 PM. Always use your most recent med list.          ADVAIR DISKUS 100-50 MCG/DOSE Aepb Generic drug:  Fluticasone-Salmeterol Inhale 1 puff into the lungs 2 (two) times daily.   albuterol 108 (90 Base) MCG/ACT inhaler Commonly known as:  PROVENTIL HFA;VENTOLIN HFA Inhale into the lungs every 6 (six) hours as needed for wheezing or shortness of  breath.   amLODipine 10 MG tablet Commonly known as:  NORVASC Take 10 mg by mouth daily.   CALCIUM 500 PO Take 1 tablet by mouth.   hydrochlorothiazide 25 MG tablet Commonly known as:  HYDRODIURIL Take 25 mg by mouth daily.   lisinopril 40 MG tablet Commonly known as:  PRINIVIL,ZESTRIL Take 40 mg by mouth daily.   mirabegron ER 25 MG Tb24 tablet Commonly known as:  MYRBETRIQ Take 1 tablet (25 mg total) by mouth daily.   mirabegron ER 50 MG Tb24 tablet Commonly known as:  MYRBETRIQ Take 1 tablet (50 mg total) by mouth daily.   omeprazole 40 MG capsule Commonly known as:  PRILOSEC Take 40 mg by mouth daily.   pravastatin 40 MG tablet Commonly known as:  PRAVACHOL   venlafaxine XR 37.5 MG 24 hr capsule Commonly known as:  EFFEXOR-XR Take 37.5 mg by mouth daily with breakfast.   VITRON-C PO Take 1 tablet by mouth.       Allergies: No Known Allergies  Family History: Family History  Problem Relation Age of Onset  . Cancer Father   . Hypertension Mother   . Cancer Brother   . Hypertension Sister   . Bladder Cancer Sister     Social History:  reports that she quit smoking about 46 years ago. She has never used smokeless tobacco. She reports that she does not drink  alcohol or use drugs.  ROS: UROLOGY Frequent Urination?: Yes Hard to postpone urination?: Yes Burning/pain with urination?: No Get up at night to urinate?: Yes Leakage of urine?: Yes Urine stream starts and stops?: No Trouble starting stream?: No Do you have to strain to urinate?: No Blood in urine?: No Urinary tract infection?: No Sexually transmitted disease?: No Injury to kidneys or bladder?: No Painful intercourse?: No Weak stream?: No Currently pregnant?: No Vaginal bleeding?: No Last menstrual period?: n  Gastrointestinal Nausea?: No Vomiting?: No Indigestion/heartburn?: No Diarrhea?: No Constipation?: No  Constitutional Fever: No Night sweats?: No Weight loss?:  No Fatigue?: No  Skin Skin rash/lesions?: No Itching?: No  Eyes Blurred vision?: No Double vision?: No  Ears/Nose/Throat Sore throat?: No Sinus problems?: No  Hematologic/Lymphatic Swollen glands?: No Easy bruising?: No  Cardiovascular Leg swelling?: No Chest pain?: No  Respiratory Cough?: No Shortness of breath?: No  Endocrine Excessive thirst?: No  Musculoskeletal Back pain?: No Joint pain?: No  Neurological Headaches?: No Dizziness?: No  Psychologic Depression?: No Anxiety?: No  Physical Exam: BP (!) 170/74 (BP Location: Left Arm, Patient Position: Sitting, Cuff Size: Normal)   Pulse (!) 108   Ht  (1.626 m)   Wt 155 lb 11.2 oz (70.6 kg)   BMI 26.73 kg/m   Constitutional:  Alert and oriented, No acute distress. HEENT: [place] AT, moist mucus membranes.  Trachea midline, no masses. Cardiovascular: No clubbing, cyanosis, or edema. Respiratory: Normal respiratory effort, no increased work of breathing. GI: Abdomen is soft, nontender, nondistended, no abdominal masses GU: No CVA tenderness.  Skin: No rashes, bruises or suspicious lesions. Lymph: No cervical or inguinal adenopathy. Neurologic: Grossly intact, no focal deficits, moving all 4 extremities. Psychiatric: Normal mood and affect.  Laboratory Data: No results found for: WBC, HGB, HCT, MCV, PLT  No results found for: CREATININE  No results found for: PSA  No results found for: TESTOSTERONE  No results found for: HGBA1C  Urinalysis    Component Value Date/Time   APPEARANCEUR Clear 10/04/2015 1103   GLUCOSEU Negative 10/04/2015 1103   BILIRUBINUR Negative 10/04/2015 1103   PROTEINUR Negative 10/04/2015 1103   NITRITE Negative 10/04/2015 1103   LEUKOCYTESUR 2+ (A) 10/04/2015 1103      Assessment & Plan:    1. Gross hematuria Benign work up. Will need to repeat one U/A one more in one year  2. Left Bosniak 1 renal cyst Benign lesion. No further work up required.  3. Urge  incontinence Will increase Mybetriq to 50 mg daily to try to decrease her nocturia. Follow up in one year  Return in about 1 year (around 10/30/2017).  Hildred Laser, MD  [facility] [address] [place], [place] [address] [phone]

## 2017-10-29 ENCOUNTER — Ambulatory Visit: Payer: Medicare Other

## 2017-11-09 ENCOUNTER — Ambulatory Visit: Payer: Medicare Other | Admitting: Urology

## 2017-11-23 ENCOUNTER — Other Ambulatory Visit: Payer: Self-pay

## 2017-11-23 MED ORDER — MIRABEGRON ER 50 MG PO TB24: 50.0000 mg | ORAL_TABLET | Freq: Every day | ORAL | 0 refills | Status: DC

## 2017-11-25 ENCOUNTER — Other Ambulatory Visit: Payer: Self-pay | Admitting: Physician Assistant

## 2017-12-03 ENCOUNTER — Ambulatory Visit: Payer: Medicare Other

## 2017-12-31 ENCOUNTER — Encounter: Payer: Self-pay | Admitting: Urology

## 2017-12-31 ENCOUNTER — Ambulatory Visit (INDEPENDENT_AMBULATORY_CARE_PROVIDER_SITE_OTHER): Payer: Medicare Other | Admitting: Urology

## 2017-12-31 VITALS — BP 168/74 | HR 98 | Wt 151.2 lb

## 2017-12-31 DIAGNOSIS — N3941 Urge incontinence: Secondary | ICD-10-CM

## 2017-12-31 MED ORDER — MIRABEGRON ER 50 MG PO TB24: 50.0000 mg | ORAL_TABLET | Freq: Every day | ORAL | 11 refills | Status: DC

## 2017-12-31 NOTE — Progress Notes (Signed)
12/31/2017 10:12 AM   Shelly Parks 1941-05-21 [id number]  Referring provider: No referring provider defined for this encounter.  No chief complaint on file.   HPI:  F/u --   The patient is a 77 year old female presents today for her yearly follow-up.  1. Frequency, urgency and UUI - she was on Myrbetriq 25 mg daily which was increased to 50 mg daily in 2018. Prior to starting this medication, she had significant daytime frequency and nocturia. She also had episodes of urge incontinence which have resolved with this medication.   2. History of gross hematuria - Benign work up in December 2016 with CT and cystoscopy.   3. Left Bosniak 1 renal cyst Benign lesion. Found during hematuria work up. No further work up required.  Today, she is doing well on Myrbetriq 50 mg. No dysuria, UTI or gross hematuria.    PMH: Past Medical History:  Diagnosis Date  . Anemia   . COPD (chronic obstructive pulmonary disease) (HCC)   . Diabetes mellitus without complication (HCC)   . HLD (hyperlipidemia)   . HTN (hypertension), benign   . Incontinence of urine in female   . Major depressive disorder, single episode, unspecified   . Reflux     Surgical History: Past Surgical History:  Procedure Laterality Date  . BLADDER SURGERY  1969  . BREAST BIOPSY Right    long ago pt ? date  . BREAST SURGERY  1973    Home Medications:  Allergies as of 12/31/2017   No Known Allergies     Medication List        Accurate as of 12/31/17 10:12 AM. Always use your most recent med list.          ADVAIR DISKUS 100-50 MCG/DOSE Aepb Generic drug:  Fluticasone-Salmeterol Inhale 1 puff into the lungs 2 (two) times daily.   albuterol 108 (90 Base) MCG/ACT inhaler Commonly known as:  PROVENTIL HFA;VENTOLIN HFA Inhale into the lungs every 6 (six) hours as needed for wheezing or shortness of breath.   amLODipine 10 MG tablet Commonly known as:  NORVASC Take 10 mg by mouth daily.   CALCIUM  500 PO Take 1 tablet by mouth.   hydrochlorothiazide 25 MG tablet Commonly known as:  HYDRODIURIL Take 25 mg by mouth daily.   lisinopril 40 MG tablet Commonly known as:  PRINIVIL,ZESTRIL Take 40 mg by mouth daily.   mirabegron ER 50 MG Tb24 tablet Commonly known as:  MYRBETRIQ Take 1 tablet (50 mg total) by mouth daily.   omeprazole 40 MG capsule Commonly known as:  PRILOSEC Take 40 mg by mouth daily.   pravastatin 40 MG tablet Commonly known as:  PRAVACHOL   venlafaxine XR 37.5 MG 24 hr capsule Commonly known as:  EFFEXOR-XR Take 37.5 mg by mouth daily with breakfast.   VITRON-C PO Take 1 tablet by mouth.       Allergies: No Known Allergies  Family History: Family History  Problem Relation Age of Onset  . Cancer Father   . Hypertension Mother   . Cancer Brother   . Hypertension Sister   . Bladder Cancer Sister     Social History:  reports that she quit smoking about 47 years ago. She has never used smokeless tobacco. She reports that she does not drink alcohol or use drugs.  ROS:  Physical Exam: There were no vitals taken for this visit.  Constitutional:  Alert and oriented, No acute distress. HEENT: [place] AT, moist mucus membranes.  Trachea midline, no masses. Cardiovascular: No clubbing, cyanosis, or edema. Respiratory: Normal respiratory effort, no increased work of breathing. GI: Abdomen is soft, nontender, nondistended, no abdominal masses GU: No CVA tenderness Skin: No rashes, bruises or suspicious lesions. Neurologic: Grossly intact, no focal deficits, moving all 4 extremities. Psychiatric: Normal mood and affect.  Laboratory Data: No results found for: WBC, HGB, HCT, MCV, PLT  No results found for: CREATININE  No results found for: PSA  No results found for: TESTOSTERONE  No results found for: HGBA1C  Urinalysis    Component Value Date/Time   APPEARANCEUR Cloudy (A)  10/30/2016 1330   GLUCOSEU Negative 10/30/2016 1330   BILIRUBINUR Negative 10/30/2016 1330   PROTEINUR Negative 10/30/2016 1330   NITRITE Negative 10/30/2016 1330   LEUKOCYTESUR 1+ (A) 10/30/2016 1330    Lab Results  Component Value Date   LABMICR See below: 10/30/2016   WBCUA 11-30 (A) 10/30/2016   RBCUA None seen 10/30/2016   LABEPIT >10 (H) 10/30/2016   MUCUS Present (A) 10/30/2016   BACTERIA Few (A) 10/30/2016    No results found for this or any previous visit. No results found for this or any previous visit. No results found for this or any previous visit. No results found for this or any previous visit. No results found for this or any previous visit. No results found for this or any previous visit. No results found for this or any previous visit. No results found for this or any previous visit.  Assessment & Plan:    Frequency, urgency or nocturia - doing well on Myrbetriq. Refilled. See in 1 yr or sooner if issues.   No follow-ups on file.  Jerilee FieldMatthew Resean Brander, MD  [facility] [address], [address] [place], [place] [address] [phone]

## 2018-01-01 LAB — URINALYSIS, COMPLETE
Bilirubin, UA: NEGATIVE
Glucose, UA: NEGATIVE
KETONES UA: NEGATIVE
NITRITE UA: NEGATIVE
Protein, UA: NEGATIVE
RBC, UA: NEGATIVE
SPEC GRAV UA: 1.015 (ref 1.005–1.030)
UUROB: 0.2 mg/dL (ref 0.2–1.0)
pH, UA: 7 (ref 5.0–7.5)

## 2018-01-01 LAB — MICROSCOPIC EXAMINATION

## 2018-12-29 ENCOUNTER — Encounter: Payer: Self-pay | Admitting: Urology

## 2018-12-29 ENCOUNTER — Ambulatory Visit: Payer: Medicare Other | Admitting: Urology

## 2018-12-30 ENCOUNTER — Ambulatory Visit: Payer: Medicare Other

## 2020-04-09 ENCOUNTER — Other Ambulatory Visit: Payer: Self-pay | Admitting: Family Medicine

## 2020-04-09 DIAGNOSIS — Z13820 Encounter for screening for osteoporosis: Secondary | ICD-10-CM

## 2022-03-06 ENCOUNTER — Other Ambulatory Visit: Payer: Self-pay

## 2022-03-06 ENCOUNTER — Encounter: Payer: Self-pay | Admitting: Emergency Medicine

## 2022-03-06 ENCOUNTER — Emergency Department
Admission: EM | Admit: 2022-03-06 | Discharge: 2022-03-06 | Disposition: A | Discharge status: Home / Self Care | Payer: Medicare Other | Attending: Emergency Medicine | Admitting: Emergency Medicine

## 2022-03-06 DIAGNOSIS — I10 Essential (primary) hypertension: Secondary | ICD-10-CM | POA: Diagnosis not present

## 2022-03-06 DIAGNOSIS — R519 Headache, unspecified: Secondary | ICD-10-CM | POA: Diagnosis present

## 2022-03-06 HISTORY — DX: Unspecified asthma, uncomplicated: J45.909

## 2022-03-06 LAB — CBC
HCT: 40.9 % (ref 36.0–46.0)
Hemoglobin: 13.1 g/dL (ref 12.0–15.0)
MCH: 29.4 pg (ref 26.0–34.0)
MCHC: 32 g/dL (ref 30.0–36.0)
MCV: 91.9 fL (ref 80.0–100.0)
Platelets: 356 10*3/uL (ref 150–400)
RBC: 4.45 MIL/uL (ref 3.87–5.11)
RDW: 12.2 % (ref 11.5–15.5)
WBC: 4.7 10*3/uL (ref 4.0–10.5)
nRBC: 0 % (ref 0.0–0.2)

## 2022-03-06 LAB — BASIC METABOLIC PANEL
Anion gap: 10 (ref 5–15)
BUN: 17 mg/dL (ref 8–23)
CO2: 26 mmol/L (ref 22–32)
Calcium: 10.3 mg/dL (ref 8.9–10.3)
Chloride: 103 mmol/L (ref 98–111)
Creatinine, Ser: 0.75 mg/dL (ref 0.44–1.00)
GFR, Estimated: >60 mL/min (ref >60)
Glucose, Bld: 125 mg/dL — ABNORMAL HIGH (ref 70–99)
Potassium: 3.4 mmol/L — ABNORMAL LOW (ref 3.5–5.1)
Sodium: 139 mmol/L (ref 135–145)

## 2022-03-06 MED ORDER — AMLODIPINE BESYLATE 5 MG PO TABS
10.0000 mg | ORAL_TABLET | Freq: Once | ORAL | Status: AC
  Administered 2022-03-06: 10 mg via ORAL
  Filled 2022-03-06: qty 2

## 2022-03-06 MED ORDER — LISINOPRIL 10 MG PO TABS
40.0000 mg | ORAL_TABLET | Freq: Once | ORAL | Status: AC
  Administered 2022-03-06: 40 mg via ORAL
  Filled 2022-03-06: qty 4

## 2022-03-06 MED ORDER — HYDROCHLOROTHIAZIDE 25 MG PO TABS
25.0000 mg | ORAL_TABLET | Freq: Every day | ORAL | Status: DC
  Administered 2022-03-06: 25 mg via ORAL
  Filled 2022-03-06: qty 1

## 2022-03-06 NOTE — ED Triage Notes (Addendum)
Patient sent to ED from PCP for hypertension. Patient's daughter states she has been on medications for BP and believes patient has been forgetting to take BP meds at home. PT states slight headache.

## 2022-03-06 NOTE — ED Provider Notes (Signed)
[facility] Provider Note    Event Date/Time   First MD Initiated Contact with Patient 03/06/22 1240     (approximate)   History   Hypertension   HPI  Shelly Parks is a 81 y.o. female who presents to the emergency department today from primary care doctor's office with concerns for high blood pressure.  Some history is obtained from daughter at bedside.  Daughter states patient is having some memory issues and does not think patient takes her medication regularly.  Patient herself has been complaining of some mild headache recently.  Denies any chest pain.      Physical Exam   Triage Vital Signs: ED Triage Vitals  Enc Vitals Group     BP 03/06/22 1145 (!) 238/140     Pulse Rate 03/06/22 1145 (!) 114     Resp 03/06/22 1145 18     Temp 03/06/22 1145 97.7 F (36.5 C)     Temp Source 03/06/22 1145 Oral     SpO2 03/06/22 1145 100 %     Weight 03/06/22 1147 145 lb (65.8 kg)     Height 03/06/22 1147 5\' 3"  (1.6 m)     Head Circumference --      Peak Flow --      Pain Score 03/06/22 1147 4     Pain Loc --      Pain Edu? --      Excl. in GC? --     Most recent vital signs: Vitals:   03/06/22 1230 03/06/22 1231  BP:  (!) 211/118  Pulse: (!) 101 99  Resp: 18 15  Temp:    SpO2: 96% 98%    General: Awake, alert. CV:  Good peripheral perfusion. Regular rate and rhythm. Resp:  Normal effort. Lungs clear. Abd:  No distention. Non tender.    ED Results / Procedures / Treatments   Labs (all labs ordered are listed, but only abnormal results are displayed) Labs Reviewed  BASIC METABOLIC PANEL - Abnormal; Notable for the following components:      Result Value   Potassium 3.4 (*)    Glucose, Bld 125 (*)    All other components within normal limits  CBC      RADIOLOGY None   PROCEDURES:  Critical Care performed: No  Procedures   MEDICATIONS ORDERED IN ED: Medications - No data to display   IMPRESSION / MDM / ASSESSMENT AND  PLAN / ED COURSE  I reviewed the triage vital signs and the nursing notes.                              Differential diagnosis includes, but is not limited to, missing medication, essential hypertension.  Patient's presentation is most consistent with acute presentation with potential threat to life or bodily function.  Patient presented to the emergency department today from doctor's office because of concern for high blood pressure.  Daughter at bedside is concerned patient's not taking her blood pressure as prescribed.  Patient's only symptom is mild headache.  Blood work without concerning electrolyte abnormality or elevation of creatinine.  No anemia or leukocytosis.  Patient was given her home medications here.  Daughter has plan to start taking medication with patient. Did discuss with patient to follow-up with primary care.  FINAL CLINICAL IMPRESSION(S) / ED DIAGNOSES   Final diagnoses:  Hypertension, unspecified type    Note:  This document was prepared using  [employer] and may include unintentional dictation errors.    Phineas Semen, MD 03/06/22 [phone]

## 2022-03-06 NOTE — Discharge Instructions (Signed)
Please seek medical attention for any high fevers, chest pain, shortness of breath, change in behavior, persistent vomiting, bloody stool or any other new or concerning symptoms.  

## 2022-03-10 ENCOUNTER — Inpatient Hospital Stay
Admission: EM | Admit: 2022-03-10 | Discharge: 2022-03-12 | DRG: 064 | Disposition: A | Discharge status: Home / Self Care | Payer: Medicare Other | Attending: Internal Medicine | Admitting: Internal Medicine

## 2022-03-10 ENCOUNTER — Observation Stay: Payer: Medicare Other

## 2022-03-10 ENCOUNTER — Emergency Department: Payer: Medicare Other

## 2022-03-10 ENCOUNTER — Encounter: Payer: Self-pay | Admitting: Emergency Medicine

## 2022-03-10 ENCOUNTER — Other Ambulatory Visit: Payer: Self-pay

## 2022-03-10 DIAGNOSIS — R4182 Altered mental status, unspecified: Principal | ICD-10-CM

## 2022-03-10 DIAGNOSIS — E119 Type 2 diabetes mellitus without complications: Secondary | ICD-10-CM

## 2022-03-10 DIAGNOSIS — E876 Hypokalemia: Secondary | ICD-10-CM | POA: Diagnosis present

## 2022-03-10 DIAGNOSIS — F0390 Unspecified dementia without behavioral disturbance: Secondary | ICD-10-CM | POA: Diagnosis present

## 2022-03-10 DIAGNOSIS — G9340 Encephalopathy, unspecified: Secondary | ICD-10-CM | POA: Diagnosis present

## 2022-03-10 DIAGNOSIS — I6381 Other cerebral infarction due to occlusion or stenosis of small artery: Secondary | ICD-10-CM | POA: Diagnosis not present

## 2022-03-10 DIAGNOSIS — J449 Chronic obstructive pulmonary disease, unspecified: Secondary | ICD-10-CM | POA: Diagnosis present

## 2022-03-10 DIAGNOSIS — Z8249 Family history of ischemic heart disease and other diseases of the circulatory system: Secondary | ICD-10-CM

## 2022-03-10 DIAGNOSIS — I10 Essential (primary) hypertension: Secondary | ICD-10-CM | POA: Diagnosis present

## 2022-03-10 DIAGNOSIS — Z7951 Long term (current) use of inhaled steroids: Secondary | ICD-10-CM

## 2022-03-10 DIAGNOSIS — Z91148 Patient's other noncompliance with medication regimen for other reason: Secondary | ICD-10-CM

## 2022-03-10 DIAGNOSIS — R413 Other amnesia: Secondary | ICD-10-CM | POA: Diagnosis present

## 2022-03-10 DIAGNOSIS — I639 Cerebral infarction, unspecified: Secondary | ICD-10-CM | POA: Diagnosis present

## 2022-03-10 DIAGNOSIS — E785 Hyperlipidemia, unspecified: Secondary | ICD-10-CM | POA: Diagnosis present

## 2022-03-10 DIAGNOSIS — E1169 Type 2 diabetes mellitus with other specified complication: Secondary | ICD-10-CM | POA: Diagnosis present

## 2022-03-10 DIAGNOSIS — Z87891 Personal history of nicotine dependence: Secondary | ICD-10-CM

## 2022-03-10 DIAGNOSIS — R29701 NIHSS score 1: Secondary | ICD-10-CM | POA: Diagnosis present

## 2022-03-10 DIAGNOSIS — G9341 Metabolic encephalopathy: Secondary | ICD-10-CM | POA: Diagnosis present

## 2022-03-10 DIAGNOSIS — Z79899 Other long term (current) drug therapy: Secondary | ICD-10-CM

## 2022-03-10 DIAGNOSIS — K219 Gastro-esophageal reflux disease without esophagitis: Secondary | ICD-10-CM | POA: Diagnosis present

## 2022-03-10 LAB — URINALYSIS, ROUTINE W REFLEX MICROSCOPIC
Bilirubin Urine: NEGATIVE
Glucose, UA: NEGATIVE mg/dL
Hgb urine dipstick: NEGATIVE
Ketones, ur: NEGATIVE mg/dL
Leukocytes,Ua: NEGATIVE
Nitrite: NEGATIVE
Protein, ur: NEGATIVE mg/dL
Specific Gravity, Urine: 1.004 — ABNORMAL LOW (ref 1.005–1.030)
pH: 6 (ref 5.0–8.0)

## 2022-03-10 LAB — CBC WITH DIFFERENTIAL/PLATELET
Abs Immature Granulocytes: 0.02 10*3/uL (ref 0.00–0.07)
Basophils Absolute: 0 10*3/uL (ref 0.0–0.1)
Basophils Relative: 0 %
Eosinophils Absolute: 0 10*3/uL (ref 0.0–0.5)
Eosinophils Relative: 0 %
HCT: 38.2 % (ref 36.0–46.0)
Hemoglobin: 12.5 g/dL (ref 12.0–15.0)
Immature Granulocytes: 0 %
Lymphocytes Relative: 22 %
Lymphs Abs: 1.1 10*3/uL (ref 0.7–4.0)
MCH: 29.6 pg (ref 26.0–34.0)
MCHC: 32.7 g/dL (ref 30.0–36.0)
MCV: 90.5 fL (ref 80.0–100.0)
Monocytes Absolute: 0.5 10*3/uL (ref 0.1–1.0)
Monocytes Relative: 10 %
Neutro Abs: 3.2 10*3/uL (ref 1.7–7.7)
Neutrophils Relative %: 68 %
Platelets: 379 10*3/uL (ref 150–400)
RBC: 4.22 MIL/uL (ref 3.87–5.11)
RDW: 12.2 % (ref 11.5–15.5)
WBC: 4.8 10*3/uL (ref 4.0–10.5)
nRBC: 0 % (ref 0.0–0.2)

## 2022-03-10 LAB — COMPREHENSIVE METABOLIC PANEL
ALT: 15 U/L (ref 0–44)
AST: 21 U/L (ref 15–41)
Albumin: 4.3 g/dL (ref 3.5–5.0)
Alkaline Phosphatase: 58 U/L (ref 38–126)
Anion gap: 9 (ref 5–15)
BUN: 25 mg/dL — ABNORMAL HIGH (ref 8–23)
CO2: 30 mmol/L (ref 22–32)
Calcium: 10 mg/dL (ref 8.9–10.3)
Chloride: 95 mmol/L — ABNORMAL LOW (ref 98–111)
Creatinine, Ser: 0.99 mg/dL (ref 0.44–1.00)
GFR, Estimated: 57 mL/min — ABNORMAL LOW (ref >60)
Glucose, Bld: 178 mg/dL — ABNORMAL HIGH (ref 70–99)
Potassium: 3.2 mmol/L — ABNORMAL LOW (ref 3.5–5.1)
Sodium: 134 mmol/L — ABNORMAL LOW (ref 135–145)
Total Bilirubin: 0.9 mg/dL (ref 0.3–1.2)
Total Protein: 7.8 g/dL (ref 6.5–8.1)

## 2022-03-10 LAB — HEMOGLOBIN A1C
Hgb A1c MFr Bld: 5.6 % (ref 4.8–5.6)
Mean Plasma Glucose: 114.02 mg/dL

## 2022-03-10 LAB — PHOSPHORUS: Phosphorus: 2.9 mg/dL (ref 2.5–4.6)

## 2022-03-10 LAB — VITAMIN B12: Vitamin B-12: 2392 pg/mL — ABNORMAL HIGH (ref 180–914)

## 2022-03-10 LAB — GLUCOSE, CAPILLARY
Glucose-Capillary: 124 mg/dL — ABNORMAL HIGH (ref 70–99)
Glucose-Capillary: 102 mg/dL — ABNORMAL HIGH (ref 70–99)

## 2022-03-10 LAB — TSH: TSH: 1.421 u[IU]/mL (ref 0.350–4.500)

## 2022-03-10 LAB — MAGNESIUM: Magnesium: 2 mg/dL (ref 1.7–2.4)

## 2022-03-10 MED ORDER — ONDANSETRON HCL 4 MG PO TABS: 4.0000 mg | ORAL_TABLET | Freq: Four times a day (QID) | ORAL | Status: DC | PRN

## 2022-03-10 MED ORDER — ACETAMINOPHEN 325 MG PO TABS: 650.0000 mg | ORAL_TABLET | Freq: Four times a day (QID) | ORAL | Status: DC | PRN

## 2022-03-10 MED ORDER — LORAZEPAM 2 MG/ML IJ SOLN: 0.5000 mg | Freq: Once | INTRAMUSCULAR | Status: DC | PRN

## 2022-03-10 MED ORDER — AMLODIPINE BESYLATE 10 MG PO TABS
10.0000 mg | ORAL_TABLET | Freq: Every day | ORAL | Status: DC
  Administered 2022-03-11 – 2022-03-12 (×2): 10 mg via ORAL
  Filled 2022-03-10 (×3): qty 1

## 2022-03-10 MED ORDER — ALBUTEROL SULFATE (2.5 MG/3ML) 0.083% IN NEBU
3.0000 mL | INHALATION_SOLUTION | Freq: Four times a day (QID) | RESPIRATORY_TRACT | Status: DC | PRN
Start: 2022-03-10 — End: 2022-03-12

## 2022-03-10 MED ORDER — PANTOPRAZOLE SODIUM 40 MG PO TBEC
40.0000 mg | DELAYED_RELEASE_TABLET | Freq: Every day | ORAL | Status: DC
  Administered 2022-03-10 – 2022-03-12 (×3): 40 mg via ORAL
  Filled 2022-03-10 (×3): qty 1

## 2022-03-10 MED ORDER — POTASSIUM CHLORIDE CRYS ER 20 MEQ PO TBCR
40.0000 meq | EXTENDED_RELEASE_TABLET | Freq: Once | ORAL | Status: AC
Start: 2022-03-10 — End: 2022-03-10
  Administered 2022-03-10: 40 meq via ORAL
  Filled 2022-03-10: qty 2

## 2022-03-10 MED ORDER — ACETAMINOPHEN 650 MG RE SUPP: 650.0000 mg | Freq: Four times a day (QID) | RECTAL | Status: DC | PRN

## 2022-03-10 MED ORDER — HALOPERIDOL LACTATE 5 MG/ML IJ SOLN
2.5000 mg | Freq: Four times a day (QID) | INTRAMUSCULAR | Status: DC | PRN
  Administered 2022-03-11: 2.5 mg via INTRAVENOUS
  Filled 2022-03-10: qty 1

## 2022-03-10 MED ORDER — HYDROCHLOROTHIAZIDE 12.5 MG PO TABS: 12.5000 mg | ORAL_TABLET | Freq: Every day | ORAL | Status: DC

## 2022-03-10 MED ORDER — LISINOPRIL 20 MG PO TABS
40.0000 mg | ORAL_TABLET | Freq: Every day | ORAL | Status: DC
Start: 2022-03-11 — End: 2022-03-11

## 2022-03-10 MED ORDER — INSULIN ASPART 100 UNIT/ML IJ SOLN
0.0000 [IU] | Freq: Three times a day (TID) | INTRAMUSCULAR | Status: DC
  Filled 2022-03-10: qty 1

## 2022-03-10 MED ORDER — ONDANSETRON HCL 4 MG/2ML IJ SOLN: 4.0000 mg | Freq: Four times a day (QID) | INTRAMUSCULAR | Status: DC | PRN

## 2022-03-10 MED ORDER — HYDRALAZINE HCL 20 MG/ML IJ SOLN: 10.0000 mg | INTRAMUSCULAR | Status: DC | PRN

## 2022-03-10 MED ORDER — ENOXAPARIN SODIUM 40 MG/0.4ML IJ SOSY
40.0000 mg | PREFILLED_SYRINGE | INTRAMUSCULAR | Status: DC
  Administered 2022-03-10 – 2022-03-11 (×2): 40 mg via SUBCUTANEOUS
  Filled 2022-03-10 (×2): qty 0.4

## 2022-03-10 MED ORDER — MOMETASONE FURO-FORMOTEROL FUM 100-5 MCG/ACT IN AERO
2.0000 | INHALATION_SPRAY | Freq: Two times a day (BID) | RESPIRATORY_TRACT | Status: DC
  Administered 2022-03-11 – 2022-03-12 (×3): 2 via RESPIRATORY_TRACT
  Filled 2022-03-10: qty 8.8

## 2022-03-10 NOTE — H&P (Signed)
History and Physical    Patient: Shelly Parks NAT:[id number] DOB: 1941/05/24 DOA: 03/10/2022 DOS: the patient was seen and examined on 03/10/2022 PCP: Shelly Pali, FNP  Patient coming from: Home  Chief Complaint:  Chief Complaint  Patient presents with   Altered Mental Status   Hypertension   HPI: Shelly Parks is a 81 y.o. female with medical history significant of anemia, asthma, COPD, type II DM, hyperlipidemia, hypertension, incontinence, major depression, GERD who is brought by her daughter to the emergency department due to worsening confusion that has been getting progressively worse over the last few months.  However, over the past few days she has been acting very strangely.  She has become very disorganized.  She threw her close all over the floor.  She broke some candles and has been throwing things at home.  Her daughter is with her, but she works and cannot supervise her.  She suspects that she has not been taking her medications correctly after her systolic blood pressure readings were recently over 200 mmHg.  Her daughter is concerned, because although she has had some decline, this has being a very drastic change from her recent baseline.  She is able to answer simple questions.  She denied headache, abdominal, back or chest pain at the moment.  ED course: Initial vital signs were temperature 98.2 F, pulse 96, respiration 12, BP 148/68 mmHg O2 sat 100% on room air.  She received received KCl 40 mEq p.o. x1 dose.  Lab work: Urinalysis was clear with decreased specific gravity.  CBC was normal.  CMP showed a potassium of 3.2 mmol/L, sodium and chloride normalized with correction.  Glucose 178 and BUN 25 mg deciliter.  The rest of the CMP results were normal  Imaging: Portable chest with no active disease.  CT head without contrast show no acute findings.  There is severe chronic small vessel ischemic disease with age-indeterminate lacunar infarcts in the thalami.   Review of  Systems: As mentioned in the history of present illness. All other systems reviewed and are negative. 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 date   BREAST SURGERY  1973   Social History:  reports that she quit smoking about 51 years ago. She has never used smokeless tobacco. She reports that she does not drink alcohol and does not use drugs.  No Known Allergies  Family History  Problem Relation Age of Onset   Cancer Father    Hypertension Mother    Cancer Brother    Hypertension Sister    Bladder Cancer Sister     Prior to Admission medications   Medication Sig Start Date End Date Taking? Authorizing Provider  albuterol (PROVENTIL HFA;VENTOLIN HFA) 108 (90 BASE) MCG/ACT inhaler Inhale into the lungs every 6 (six) hours as needed for wheezing or shortness of breath.    [provider]  amLODipine (NORVASC) 10 MG tablet Take 10 mg by mouth daily.    [provider]  Calcium-Magnesium-Vitamin D (CALCIUM 500 PO) Take  1 tablet by mouth.    [provider]  Fluticasone-Salmeterol (ADVAIR DISKUS) 100-50 MCG/DOSE AEPB Inhale 1 puff into the lungs 2 (two) times daily.    [provider]  hydrochlorothiazide (HYDRODIURIL) 25 MG tablet Take 25 mg by mouth daily.    [provider]  Iron-Vitamin C (VITRON-C PO) Take 1 tablet by mouth.    [provider]  lisinopril (PRINIVIL,ZESTRIL) 40 MG tablet Take 40 mg by mouth daily.    [provider]  mirabegron ER (MYRBETRIQ) 50 MG TB24 tablet Take 1 tablet (50 mg total) by mouth daily. 12/31/17    Jerilee Field, MD  omeprazole (PRILOSEC) 40 MG capsule Take 40 mg by mouth daily.    [provider]  pravastatin (PRAVACHOL) 40 MG tablet  10/02/15   [provider]  venlafaxine XR (EFFEXOR-XR) 37.5 MG 24 hr capsule Take 37.5 mg by mouth daily with breakfast.    [provider]    Physical Exam: Vitals:   03/10/22 1045 03/10/22 1100 03/10/22 1115 03/10/22 1245  BP:  119/68  (!) 153/76  Pulse: 78 80 87   Resp: 15 20 12    Temp:      TempSrc:      SpO2: 98% 97% 99%   Weight:      Height:       Physical Exam Vitals and nursing note reviewed.  Constitutional:      General: She is not in acute distress.    Appearance: Normal appearance.  HENT:     Head: Normocephalic.     Mouth/Throat:     Mouth: Mucous membranes are moist.  Eyes:     General: No scleral icterus.    Pupils: Pupils are equal, round, and reactive to light.  Neck:     Vascular: No JVD.  Cardiovascular:     Rate and Rhythm: Normal rate and regular rhythm.     Heart sounds: S1 normal and S2 normal.  Pulmonary:     Effort: Pulmonary effort is normal.     Breath sounds: Normal breath sounds. No wheezing, rhonchi or rales.  Abdominal:     General: Bowel sounds are normal. There is no distension.     Palpations: Abdomen is soft.     Tenderness: There is no abdominal tenderness. There is no guarding.  Musculoskeletal:     Cervical back: Neck supple.     Right lower leg: No edema.     Left lower leg: No edema.  Neurological:     General: No focal deficit present.     Mental Status: She is alert. She is disoriented and confused.     Cranial Nerves: No cranial nerve deficit.  Psychiatric:        Attention and Perception: She is inattentive.        Cognition and Memory: Cognition is impaired. Memory is impaired.   Data Reviewed:  There are no new results to review at this time.  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 Neurochecks every 6 hours. Check TSH level. Check B12 level. Check MRI of brain to determine CVA acuity. Further work-up depending on results. Consider psych evaluation if infarctions are remote. Consult transition of care team.  Active Problems:   Hypokalemia Replacing. Follow potassium level.    HLD (hyperlipidemia) Check fasting lipids in the morning.    HTN (hypertension),  benign Continue amlodipine 10 mg p.o. daily. Resume lisinopril 40 mg daily in AM. Resume/decrease in a.m. HCTZ to 12.5 mg p.o. daily. Hydralazine 10 mg IVP as needed for SBP > 179 mmHg. Monitor BP, HR, renal function electrolytes.    COPD (chronic obstructive pulmonary disease) (HCC) Continue Advair or formulary equivalent BID.    Diabetes mellitus without complication (HCC) Check hemoglobin A1c. Carbohydrate modified diet. CBG monitoring before meals and bedtime. RI SS sensitive coverage.     Advance Care Planning:   Code Status: Full Code   Consults:   Family Communication:  Hodnett,Amy Daughter [phone]  Unable to answer when I called to provide update and mailbox was full.  Severity of Illness: The appropriate patient status for this patient is OBSERVATION. Observation status is judged to be reasonable and necessary in order to provide the required intensity of service to ensure the patient's safety. The patient's presenting symptoms, physical exam findings, and initial radiographic and laboratory data in the context of their medical condition is felt to place them at decreased risk for further clinical deterioration. Furthermore, it is anticipated that the patient will be medically stable for discharge from the hospital within 2 midnights of admission.   Author: Bobette Mo, MD 03/10/2022 12:54 PM  For on call review [email or web address].   This document was prepared using  Dragon voice recognition software and may contain some unintended transcription errors.

## 2022-03-10 NOTE — ED Triage Notes (Signed)
Daughter brought pt for elevated bp this AM 160/108.  Pt has had increasing confusion and mental status changes over last couple months. Pt disoriented to time, place, and situation.  Daughter reports this is not her baseline.  Per daughter pt has been mixing her medications up and taking the wrong amounts of things.  They are working on getting her into a nursing facility.  Her urine has a foul odor daughter reports.

## 2022-03-10 NOTE — Plan of Care (Signed)
  Problem: Nutritional: Goal: Maintenance of adequate nutrition will improve Outcome: Progressing   Problem: Activity: Goal: Risk for activity intolerance will decrease Outcome: Progressing   Problem: Nutrition: Goal: Adequate nutrition will be maintained Outcome: Progressing

## 2022-03-10 NOTE — Progress Notes (Signed)
OT Cancellation Note  Patient Details Name: Shelly Parks MRN: [id number] DOB: Feb 04, 1941   Cancelled Treatment:    Reason Eval/Treat Not Completed: Patient declined, no reason specified. Upon arrival to unit, pt sitting in w/c next to charge nurse. RN reporting that pt will not stay in bed/chair despite alarms going off repeatedly and cues for safety awareness. Pt politely declined OT attempting to bring her back to room in order to complete evaluation despite max encouragement. Will re-attempt at later date/time.   Dorene Grebe  [facility] 03/10/2022, 4:24 PM

## 2022-03-10 NOTE — ED Provider Notes (Signed)
[facility] Provider Note    Event Date/Time   First MD Initiated Contact with Patient 03/10/22 [phone]     (approximate)   History   Altered Mental Status and Hypertension   HPI  Shelly Parks is a 81 y.o. female past medical history of hypertension diabetes depression who presents with altered mental status.  Patient is here with her daughter.  She does not know why she is here has no complaints.  Her daughter tells me that over the last week she has been altered.  She has been having some memory issues over the last several months but this week she is more confused and has been acting strangely.  She lives with her daughter but her daughter works for shift so she is home alone during the day.  Daughter found her room very messy and disorganized she had thrown close all over the ground had also taken several candles and broke the candles.  They are not aware of any falls.  She is still walking.  She is eating and drinking.  Patient denies headache visual change numbness tingling weakness chest pain shortness of breath or abdominal pain.  Denies pain with urination.     Past Medical History:  Diagnosis Date   Anemia    Asthma    COPD (chronic obstructive pulmonary disease) (HCC)    Diabetes mellitus without complication (HCC)    HLD (hyperlipidemia)    HTN (hypertension), benign    Incontinence of urine in female    Major depressive disorder, single episode, unspecified    Reflux     Patient Active Problem List   Diagnosis Date Noted   Acute encephalopathy 03/10/2022   HLD (hyperlipidemia)    HTN (hypertension), benign    Anemia    Major depressive disorder, single episode, unspecified    Reflux    COPD (chronic obstructive pulmonary disease) (HCC)    Incontinence of urine in female    Diabetes mellitus without complication [facility])      Physical Exam  Triage Vital Signs: ED Triage Vitals [03/10/22 0947]  Enc Vitals Group     BP      Pulse       Resp      Temp      Temp src      SpO2      Weight 145 lb (65.8 kg)     Height 5\' 3"  (1.6 m)     Head Circumference      Peak Flow      Pain Score 2     Pain Loc      Pain Edu?      Excl. in GC?     Most recent vital signs: Vitals:   03/10/22 1403 03/10/22 1410  BP: (!) 168/78 (!) 164/70  Pulse: 96 78  Resp: 16 18  Temp: 98 F (36.7 C) 98.2 F (36.8 C)  SpO2: 100% 100%     General: Awake, no distress.  CV:  Good peripheral perfusion.  Resp:  Normal effort.  Abd:  No distention.  Neuro:             Awake, Alert, Oriented x 2-thinks it is in the 1990s Other:  Aox3, nml speech  PERRL, EOMI, face symmetric, nml tongue movement  5/5 strength in the BL upper and lower extremities  Sensation grossly intact in the BL upper and lower extremities  Finger-nose-finger intact BL    ED Results / Procedures / Treatments  Labs (all labs ordered are listed, but only abnormal results are displayed) Labs Reviewed  COMPREHENSIVE METABOLIC PANEL - Abnormal; Notable for the following components:      Result Value   Sodium 134 (*)    Potassium 3.2 (*)    Chloride 95 (*)    Glucose, Bld 178 (*)    BUN 25 (*)    GFR, Estimated 57 (*)    All other components within normal limits  URINALYSIS, ROUTINE W REFLEX MICROSCOPIC - Abnormal; Notable for the following components:   Color, Urine STRAW (*)    APPearance CLEAR (*)    Specific Gravity, Urine 1.004 (*)    All other components within normal limits  CBC WITH DIFFERENTIAL/PLATELET  MAGNESIUM  PHOSPHORUS  TSH  HEMOGLOBIN A1C  VITAMIN B12     EKG  EKG interpreted by myself shows sinus rhythm with significant LVH and reports Tatian abnormality no acute ischemic changes   RADIOLOGY I reviewed and interpreted the CT scan of the brain which does not show any acute intracranial process    PROCEDURES:  Critical Care performed: No  .1-3 Lead EKG Interpretation  Performed by: Georga Hacking, MD Authorized by: Georga Hacking, MD     Interpretation: normal     ECG rate assessment: normal     Rhythm: sinus rhythm     Ectopy: none     Conduction: normal     The patient is on the cardiac monitor to evaluate for evidence of arrhythmia and/or significant heart rate changes.   MEDICATIONS ORDERED IN ED: Medications  enoxaparin (LOVENOX) injection 40 mg (has no administration in time range)  acetaminophen (TYLENOL) tablet 650 mg (has no administration in time range)    Or  acetaminophen (TYLENOL) suppository 650 mg (has no administration in time range)  ondansetron (ZOFRAN) tablet 4 mg (has no administration in time range)    Or  ondansetron (ZOFRAN) injection 4 mg (has no administration in time range)  insulin aspart (novoLOG) injection 0-9 Units (has no administration in time range)  haloperidol lactate (HALDOL) injection 2.5 mg (has no administration in time range)  potassium chloride SA (KLOR-CON M) CR tablet 40 mEq (40 mEq Oral Given 03/10/22 1249)     IMPRESSION / MDM / ASSESSMENT AND PLAN / ED COURSE  I reviewed the triage vital signs and the nursing notes.                              Patient's presentation is most consistent with acute presentation with potential threat to life or bodily function.  Differential diagnosis includes, but is not limited to, worsening dementia, electrolyte abnormality, hypovolemia, infection including UTI, viral infection, pneumonia, intracranial hemorrhage, CVA, medication side effect  Patient is an 81 year old female with history of hypertension who presents with altered mental status.  Her daughter who she lives with notes that she has had more memory difficulty over the last several months but then over the last week has been acting atypically tearing apart her room breaking things and being more agitated.  She was seen in the ED several days ago for hypertension was started back on her medication.  Her daughter notes that how she has today is different from  when they were seen in the ED initially.  Patient is alert and oriented x2 does not know the year.  She does not seem to know why she is here but the rest of her  neurologic exam is nonfocal.  She has no complaints.  Abdomen is soft nontender.  Labs including a CBC and BMP are overall showing.  She does have mild hypokalemia with potassium 3.2 which was supplemented orally.  CT head has age-indeterminate infarcts in the thalami but no acute abnormalities.  Chest x-ray does not show pneumonia.   Gust the overall reassuring work-up with patient's family.  They are concerned because she is not at her baseline and her daughter works during the day so cannot take care of her.  They are trying to arrange for SNF placement as an outpatient.  I suspicion is that this is just progression of dementia but given this is an acute change over the last week I will discuss with the hospitalist to either consult or admit the patient.       FINAL CLINICAL IMPRESSION(S) / ED DIAGNOSES   Final diagnoses:  Altered mental status, unspecified altered mental status type     Rx / DC Orders   ED Discharge Orders     None        Note:  This document was prepared using Dragon voice recognition software and may include unintentional dictation errors.   Georga Hacking, MD 03/10/22 1540

## 2022-03-11 ENCOUNTER — Inpatient Hospital Stay: Payer: Medicare Other

## 2022-03-11 ENCOUNTER — Encounter: Payer: Self-pay | Admitting: Internal Medicine

## 2022-03-11 ENCOUNTER — Observation Stay: Payer: Medicare Other

## 2022-03-11 ENCOUNTER — Observation Stay (HOSPITAL_COMMUNITY)
Admit: 2022-03-11 | Discharge: 2022-03-11 | Disposition: A | Discharge status: Home / Self Care | Payer: Medicare Other | Attending: Internal Medicine | Admitting: Internal Medicine

## 2022-03-11 DIAGNOSIS — E876 Hypokalemia: Secondary | ICD-10-CM | POA: Diagnosis present

## 2022-03-11 DIAGNOSIS — R29701 NIHSS score 1: Secondary | ICD-10-CM | POA: Diagnosis present

## 2022-03-11 DIAGNOSIS — E1169 Type 2 diabetes mellitus with other specified complication: Secondary | ICD-10-CM | POA: Diagnosis present

## 2022-03-11 DIAGNOSIS — F0390 Unspecified dementia without behavioral disturbance: Secondary | ICD-10-CM | POA: Diagnosis present

## 2022-03-11 DIAGNOSIS — G9341 Metabolic encephalopathy: Secondary | ICD-10-CM | POA: Diagnosis present

## 2022-03-11 DIAGNOSIS — I6389 Other cerebral infarction: Secondary | ICD-10-CM

## 2022-03-11 DIAGNOSIS — Z79899 Other long term (current) drug therapy: Secondary | ICD-10-CM | POA: Diagnosis not present

## 2022-03-11 DIAGNOSIS — Z7951 Long term (current) use of inhaled steroids: Secondary | ICD-10-CM | POA: Diagnosis not present

## 2022-03-11 DIAGNOSIS — I639 Cerebral infarction, unspecified: Secondary | ICD-10-CM | POA: Diagnosis not present

## 2022-03-11 DIAGNOSIS — Z91148 Patient's other noncompliance with medication regimen for other reason: Secondary | ICD-10-CM | POA: Diagnosis not present

## 2022-03-11 DIAGNOSIS — I10 Essential (primary) hypertension: Secondary | ICD-10-CM | POA: Diagnosis present

## 2022-03-11 DIAGNOSIS — J449 Chronic obstructive pulmonary disease, unspecified: Secondary | ICD-10-CM | POA: Diagnosis present

## 2022-03-11 DIAGNOSIS — Z87891 Personal history of nicotine dependence: Secondary | ICD-10-CM | POA: Diagnosis not present

## 2022-03-11 DIAGNOSIS — K219 Gastro-esophageal reflux disease without esophagitis: Secondary | ICD-10-CM | POA: Diagnosis present

## 2022-03-11 DIAGNOSIS — G9340 Encephalopathy, unspecified: Secondary | ICD-10-CM | POA: Diagnosis present

## 2022-03-11 DIAGNOSIS — Z8249 Family history of ischemic heart disease and other diseases of the circulatory system: Secondary | ICD-10-CM | POA: Diagnosis not present

## 2022-03-11 DIAGNOSIS — I6381 Other cerebral infarction due to occlusion or stenosis of small artery: Secondary | ICD-10-CM | POA: Diagnosis present

## 2022-03-11 DIAGNOSIS — E785 Hyperlipidemia, unspecified: Secondary | ICD-10-CM

## 2022-03-11 LAB — ECHOCARDIOGRAM COMPLETE
AR max vel: 2.1 cm2
AV Area VTI: 2.15 cm2
AV Area mean vel: 2.07 cm2
AV Mean grad: 5 mmHg
AV Peak grad: 8.6 mmHg
Ao pk vel: 1.47 m/s
Area-P 1/2: 2.83 cm2
Height: 63 in
S' Lateral: 1.78 cm
Weight: 2045.87 oz

## 2022-03-11 LAB — LIPID PANEL
Cholesterol: 221 mg/dL — ABNORMAL HIGH (ref 0–200)
HDL: 70 mg/dL (ref >40)
LDL Cholesterol: 137 mg/dL — ABNORMAL HIGH (ref 0–99)
Total CHOL/HDL Ratio: 3.2 RATIO
Triglycerides: 69 mg/dL (ref <150)
VLDL: 14 mg/dL (ref 0–40)

## 2022-03-11 LAB — GLUCOSE, CAPILLARY
Glucose-Capillary: 120 mg/dL — ABNORMAL HIGH (ref 70–99)
Glucose-Capillary: 111 mg/dL — ABNORMAL HIGH (ref 70–99)

## 2022-03-11 LAB — BASIC METABOLIC PANEL
Anion gap: 7 (ref 5–15)
BUN: 23 mg/dL (ref 8–23)
CO2: 28 mmol/L (ref 22–32)
Calcium: 10 mg/dL (ref 8.9–10.3)
Chloride: 103 mmol/L (ref 98–111)
Creatinine, Ser: 0.96 mg/dL (ref 0.44–1.00)
GFR, Estimated: 59 mL/min — ABNORMAL LOW (ref >60)
Glucose, Bld: 103 mg/dL — ABNORMAL HIGH (ref 70–99)
Potassium: 3.6 mmol/L (ref 3.5–5.1)
Sodium: 138 mmol/L (ref 135–145)

## 2022-03-11 MED ORDER — IOHEXOL 350 MG/ML SOLN
75.0000 mL | Freq: Once | INTRAVENOUS | Status: AC | PRN
  Administered 2022-03-11: 75 mL via INTRAVENOUS

## 2022-03-11 MED ORDER — ATORVASTATIN CALCIUM 20 MG PO TABS
40.0000 mg | ORAL_TABLET | Freq: Every evening | ORAL | Status: DC
Start: 2022-03-11 — End: 2022-03-12
  Administered 2022-03-11: 40 mg via ORAL
  Filled 2022-03-11: qty 2

## 2022-03-11 MED ORDER — CLOPIDOGREL BISULFATE 75 MG PO TABS
75.0000 mg | ORAL_TABLET | Freq: Every day | ORAL | Status: DC
Start: 2022-03-11 — End: 2022-03-12
  Administered 2022-03-11 – 2022-03-12 (×2): 75 mg via ORAL
  Filled 2022-03-11 (×2): qty 1

## 2022-03-11 MED ORDER — LORAZEPAM 2 MG/ML IJ SOLN
0.5000 mg | Freq: Once | INTRAMUSCULAR | Status: AC | PRN
  Administered 2022-03-11: 0.5 mg via INTRAVENOUS
  Filled 2022-03-11: qty 1

## 2022-03-11 MED ORDER — ASPIRIN 81 MG PO CHEW
324.0000 mg | CHEWABLE_TABLET | Freq: Once | ORAL | Status: AC
  Administered 2022-03-11: 324 mg via ORAL
  Filled 2022-03-11: qty 4

## 2022-03-11 MED ORDER — ASPIRIN 81 MG PO TBEC
81.0000 mg | DELAYED_RELEASE_TABLET | Freq: Every day | ORAL | Status: DC
  Administered 2022-03-12: 81 mg via ORAL
  Filled 2022-03-11: qty 1

## 2022-03-11 MED ORDER — STROKE: EARLY STAGES OF RECOVERY BOOK: Freq: Once | Status: AC

## 2022-03-11 MED ORDER — LORAZEPAM 2 MG/ML IJ SOLN
0.5000 mg | Freq: Once | INTRAMUSCULAR | Status: AC
  Administered 2022-03-11: 0.5 mg via INTRAVENOUS
  Filled 2022-03-11: qty 1

## 2022-03-11 NOTE — Assessment & Plan Note (Signed)
Hemoglobin A1c actually low at 5.6.  Change pravastatin over to Lipitor.

## 2022-03-11 NOTE — Assessment & Plan Note (Signed)
Secondary to stroke.  TSH and B12 normal range.  Check an RPR.

## 2022-03-11 NOTE — Progress Notes (Signed)
Progress Note   Patient: Shelly Parks BZJ:[id number] DOB: [birth year] DOA: 03/10/2022     0 DOS: the patient was seen and examined on 03/11/2022   Brief hospital course: 81 year old female with past medical history of asthma/COPD, type 2 diabetes, hyperlipidemia, hypertension, depression, GERD and anemia.  Patient is brought in by family with altered mental status and high blood pressure readings.  MRI showing patchy small acute ischemic nonhemorrhagic left infarct involving the left temporal occipital region.  Patient was started on aspirin Plavix and Lipitor.  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  Patient's main deficit is cognitive, she had confusion at home.  I started aspirin and Plavix this morning.  Pravastatin switched to Lipitor, LDL 137.  CT angio head and neck still pending.  Echocardiogram shows a EF above normal range.  Acute metabolic encephalopathy Secondary to stroke.  TSH and B12 normal range.  Check an RPR.  Type 2 diabetes mellitus with hyperlipidemia (HCC) Hemoglobin A1c actually low at 5.6.  Change pravastatin over to Lipitor.  Essential hypertension Continue Norvasc.  Other 2 blood pressure medications held to avoid hypotension.  Can restart if blood pressure starts to rise.  GERD (gastroesophageal reflux disease) Continue PPI  COPD (chronic obstructive pulmonary disease) (HCC) Respiratory status stable continue Advair equivalent.        Subjective: Patient brought in for cognitive issues.  She put her depends in the washer.  She also broke candles in her daughter's room.  Found to have a stroke on MRI.  Physical Exam: Vitals:   03/10/22 1410 03/10/22 1928 03/11/22 0908 03/11/22 1402  BP: (!) 164/70 (!) 117/58 (!) 143/64 124/68  Pulse: 78 74 88 82  Resp: 18 16  17   Temp: 98.2 F (36.8 C) 98.1 F (36.7 C) 97.9 F (36.6 C) 98.2 F (36.8 C)  TempSrc: Oral Oral    SpO2: 100%  97% 100% 99%  Weight:      Height:       Physical Exam HENT:     Head: Normocephalic.     Mouth/Throat:     Pharynx: No oropharyngeal exudate.  Eyes:     General: Lids are normal.     Extraocular Movements: Extraocular movements intact.     Conjunctiva/sclera: Conjunctivae normal.  Cardiovascular:     Rate and Rhythm: Normal rate and regular rhythm.     Heart sounds: Normal heart sounds, S1 normal and S2 normal.  Pulmonary:     Breath sounds: No decreased breath sounds, wheezing, rhonchi or rales.  Abdominal:     Palpations: Abdomen is soft.     Tenderness: There is no abdominal tenderness.  Musculoskeletal:     Right lower leg: No swelling.     Left lower leg: No swelling.  Skin:    General: Skin is warm.     Findings: No rash.  Neurological:     Mental Status: She is alert.     Comments: Answers simple questions appropriately.  Needed to be cued a couple different times in order to do certain commands.  Power 5 out of 5 upper and lower extremities.  Heel-to-shin is impaired bilaterally but this was likely because of cognitive issues rather than a strength issue.  Finger-nose intact.     Data Reviewed: Hemoglobin A1c 5.6, B12 2392, LDL 137, creatinine 0.96, sodium 138, potassium 3.6, CBC normal range  Family Communication: Spoke with granddaughter at the bedside and daughter  on the phone  Disposition: Status is:  Change to inpatient with acute stroke.  With cognitive issues need to have a safe disposition.  Planned Discharge Destination: To be determined.  Family will look into [facility]    Time spent: 28 minutes  Author: Alford Highland, MD 03/11/2022 2:42 PM  For on call review [email or web address].

## 2022-03-11 NOTE — Hospital Course (Signed)
81 year old female with past medical history of asthma/COPD, type 2 diabetes, hyperlipidemia, hypertension, depression, GERD and anemia.  Patient is brought in by family with altered mental status and high blood pressure readings.  MRI showing patchy small acute ischemic nonhemorrhagic left infarct involving the left temporal occipital region.  Patient was started on aspirin Plavix and Lipitor. Patient is seen by neurology, recommended high-dose of atorvastatin 80 mg daily, aspirin 81 mg daily.  Plavix 75-minute daily x3 weeks. Patient condition had improved, currently medically stable to be discharged.  Patient has a significant cognitive impairment, does not qualify for inpatient rehab.  We will set up PT/OT/RN/aids at time of discharge.  Patient be also followed by PCP and neurology. Also contacted Cardiology to Set up a ZIO Recorder.

## 2022-03-11 NOTE — Progress Notes (Signed)
   03/11/22 1300  Clinical Encounter Type  Visited With Patient and family together  Visit Type Initial  Referral From Nurse  Consult/Referral To Chaplain   Chaplain facilitated completion of HCPOA

## 2022-03-11 NOTE — Assessment & Plan Note (Signed)
Continue Norvasc.  Other 2 blood pressure medications held to avoid hypotension.  Can restart if blood pressure starts to rise.

## 2022-03-11 NOTE — Assessment & Plan Note (Signed)
MRI showing acute stroke in the left temporal occipital area.  Patient's main deficit is cognitive, she had confusion at home.  I started aspirin and Plavix this morning.  Pravastatin switched to Lipitor, LDL 137.  CT angio head and neck still pending.  Echocardiogram shows a EF above normal range.

## 2022-03-11 NOTE — Progress Notes (Signed)
PT Cancellation Note  Patient Details Name: Shelly Parks MRN: [id number] DOB: Jul 17, 1940   Cancelled Treatment:    Reason Eval/Treat Not Completed: Patient at procedure or test/unavailable (Additional re-attempt for evaluation.  Patient preparing to leave unit for diagnostic testing.  Will continue efforts later time/date as medically appropriate and available.)  Ledell Codrington H. Manson Passey, PT, DPT, NCS 03/11/22, 2:45 PM [phone]

## 2022-03-11 NOTE — Assessment & Plan Note (Signed)
Continue PPI ?

## 2022-03-11 NOTE — Progress Notes (Signed)
PT Cancellation Note  Patient Details Name: Shelly Parks MRN: [id number] DOB: [birth year]   Cancelled Treatment:    Reason Eval/Treat Not Completed:  (Patient to room, multiple family members at bedside. Patient standing at bedside, family assisting with return to bed. Family reports patient recently walked with OT and request PT hold at this time; prefers PT wait until angio completed (patient unable to complete earlier) and re-attempt at later time.)  Of note, per OT, patient ambulatory without assist device; safely retrieves items from floor and demonstrates good functional balance during OT evaluation.  Recommending 24 hour supervision for baseline cognition/safety at discharge.   Danahi Reddish H. Manson Passey, PT, DPT, NCS 03/11/22, 11:27 AM [phone]

## 2022-03-11 NOTE — Evaluation (Addendum)
Occupational Therapy Evaluation Patient Details Name: Shelly Parks MRN: [id number] DOB: 1940/08/12 Today's Date: 03/11/2022   History of Present Illness Shelly Parks is a 81 y.o. female with medical history significant of anemia, asthma, COPD, type II DM, hyperlipidemia, hypertension, incontinence, major depression, GERD who is brought by her daughter to the emergency department due to worsening confusion that has been getting progressively worse over the last few months.   Clinical Impression  Patient presenting with decrease independence in cognitive functioning. Granddaughter present in room during eval. At baseline, pt was independent with ADL's and IADLs, and functional mobility with out any assistive devices. Pt currently functioning at supervision for bed mobility, functional mobility, transfers, and set up/supervision for grooming task. Pt was able to ambulate ~21ft around nursing station, retrieve items from floor without LOB, transfer from sit <> stand from toilet, and wash hands independently. Pt had some difficulty following commands during session and would frequently redirection to stay on task. Patient will benefit from acute OT to increase overall independence in the areas of ADLs, functional mobility in order to safely discharge to the next venue of care.     Recommendations for follow up therapy are one component of a multi-disciplinary discharge planning process, led by the attending physician.  Recommendations may be updated based on patient status, additional functional criteria and insurance authorization.   Follow Up Recommendations  Home health OT    Assistance Recommended at Discharge Frequent or constant Supervision/Assistance  Patient can return home with the following A little help with bathing/dressing/bathroom;A little help with walking and/or transfers;Assist for transportation;Assistance with cooking/housework;Direct supervision/assist for financial management     Functional Status Assessment  Patient has had a recent decline in their functional status and demonstrates the ability to make significant improvements in function in a reasonable and predictable amount of time.  Equipment Recommendations  Other (comment) (Defer to next venue of care)    Recommendations for Other Services       Precautions / Restrictions Precautions Precautions: Fall      Mobility Bed Mobility Overal bed mobility: Modified Independent                  Transfers   Equipment used: None                      Balance Overall balance assessment: Modified Independent                                         ADL either performed or assessed with clinical judgement   ADL Overall ADL's : Modified independent                                             Vision Baseline Vision/History: 1 Wears glasses              Pertinent Vitals/Pain Pain Assessment Pain Assessment: No/denies pain        Extremity/Trunk Assessment Upper Extremity Assessment Upper Extremity Assessment: Generalized weakness   Lower Extremity Assessment Lower Extremity Assessment: Generalized weakness          Cognition     Overall Cognitive Status: Impaired/Different from baseline Area of Impairment: Orientation, Problem solving, Safety/judgement, Awareness, Memory, Following commands  Memory: Decreased short-term memory Following Commands: Follows one step commands inconsistently Safety/Judgement: Decreased awareness of safety, Decreased awareness of deficits Awareness: Intellectual Problem Solving: Slow processing, Difficulty sequencing, Requires verbal cues, Requires tactile cues, Decreased initiation General Comments: decreased processing skills                Home Living Family/patient expects to be discharged to:: Private residence Living Arrangements: Children                                       Prior Functioning/Environment Prior Level of Function : Independent/Modified Independent                        OT Problem List: Decreased cognition;Decreased safety awareness      OT Treatment/Interventions:      OT Goals(Current goals can be found in the care plan section) Acute Rehab OT Goals Patient Stated Goal: to return home. OT Goal Formulation: With patient/family Time For Goal Achievement: 03/25/22 Potential to Achieve Goals: Fair ADL Goals Additional ADL Goal #1: Patient will place medication in pill box with min A in 3 consecutive sessions. Additional ADL Goal #2: Patient will create a grocery list for 2-3 meals with min A for 3 consecutive sessions. Additional ADL Goal #3: Patient will determine 3 tasks to complete each day, related to self care in 3 consecutive sessions.  OT Frequency: Min 2X/week       AM-PAC OT "6 Clicks" Daily Activity     Outcome Measure Help from another person eating meals?: None Help from another person taking care of personal grooming?: A Little Help from another person toileting, which includes using toliet, bedpan, or urinal?: A Little Help from another person bathing (including washing, rinsing, drying)?: A Little Help from another person to put on and taking off regular upper body clothing?: A Little Help from another person to put on and taking off regular lower body clothing?: A Little 6 Click Score: 19   End of Session Equipment Utilized During Treatment: Gait belt Nurse Communication:  (physical assessment looks good, very poor cognitive processing skills.)  Activity Tolerance: Patient tolerated treatment well Patient left: in bed;with call bell/phone within reach;with bed alarm set;with nursing/sitter in room  OT Visit Diagnosis: Other symptoms and signs involving cognitive function                Time: 4276-7011 OT Time Calculation (min): 21 min Charges:      Lilli Few,  OTS 03/11/2022, 12:43 PM

## 2022-03-11 NOTE — TOC Progression Note (Addendum)
Transition of Care [facility]) - Progression Note    Patient Details  Name: Shelly Parks MRN: [id number] Date of Birth: 1941-04-14  Transition of [facility]) CM/SW Contact  Marlowe Sax, RN Phone Number: 03/11/2022, 12:16 PM  Clinical Narrative:     Spoke to the patient's daughter that is in the room, (Amy) they are agreeable to a bedsearch and wants to go to [facility] she stated that she spoke to [place] at [facility], sent to [facility] and requested a SNF bed   [facility] has declined making a bed offer    Expected Discharge Plan and Services                                                 Social Determinants of Health (SDOH) Interventions    Readmission Risk Interventions     No data to display

## 2022-03-11 NOTE — Progress Notes (Signed)
SLP Cancellation Note  Patient Details Name: Shelly Parks MRN: [id number] DOB: 1941-02-10   Cancelled treatment:       Reason Eval/Treat Not Completed: Patient at procedure or test/unavailable (chart reviewed; consulted NSG. Secretary stated pt is at CT.) Will f/u tomorrow for assessment.     Jerilynn Som, MS, CCC-SLP Speech Language Pathologist Rehab Services; [facility] [phone] (ascom) Tabetha Haraway 03/11/2022, 3:30 PM

## 2022-03-11 NOTE — Plan of Care (Signed)

## 2022-03-11 NOTE — Assessment & Plan Note (Signed)
Respiratory status stable continue Advair equivalent.

## 2022-03-11 NOTE — Progress Notes (Signed)
PT Cancellation Note  Patient Details Name: Shelly Parks MRN: [id number] DOB: 03/29/1941   Cancelled Treatment:    Reason Eval/Treat Not Completed: Patient at procedure or test/unavailable (Consult received and chart reviewed. Patient currently off unit for diagnostic procedure. Will re-attempt at later time/date as medically appropriate and available.)   Lestat Golob H. Manson Passey, PT, DPT, NCS 03/11/22, 10:44 AM [phone]

## 2022-03-11 NOTE — TOC Initial Note (Signed)
Transition of Care [facility]) - Initial/Assessment Note    Patient Details  Name: Shelly Parks MRN: [id number] Date of Birth: Jul 19, 1940  Transition of Care [facility]) CM/SW Contact:    Durenda Guthrie, RN Phone Number: 03/11/2022, 10:59 AM  Clinical Narrative:                 Transition of Care Screening Note:  Transition of Care Department [facility]) has reviewed patient and no TOC needs have been identified at this time. We will continue to monitor patient advancement through Interdisciplinary progressions. If new patient transition needs arise, please place a consult.          Patient Goals and CMS Choice        Expected Discharge Plan and Services                                                Prior Living Arrangements/Services                       Activities of Daily Living Home Assistive Devices/Equipment: Eyeglasses ADL Screening (condition at time of admission) Patient's cognitive ability adequate to safely complete daily activities?: Yes Is the patient deaf or have difficulty hearing?: No Does the patient have difficulty seeing, even when wearing glasses/contacts?: No Does the patient have difficulty concentrating, remembering, or making decisions?: Yes Patient able to express need for assistance with ADLs?: Yes Does the patient have difficulty dressing or bathing?: No Independently performs ADLs?: Yes (appropriate for developmental age) Does the patient have difficulty walking or climbing stairs?: No Weakness of Legs: None Weakness of Arms/Hands: None  Permission Sought/Granted                  Emotional Assessment              Admission diagnosis:  Acute encephalopathy [G93.40] Altered mental status, unspecified altered mental status type [R41.82] Patient Active Problem List   Diagnosis Date Noted   Acute encephalopathy 03/10/2022   Hypokalemia 03/10/2022   Memory changes 03/10/2022   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 [place] [address] Phone: [phone] Fax: [phone]     Social Determinants of Health (SDOH) Interventions    Readmission Risk Interventions     No data to display

## 2022-03-11 NOTE — Consult Note (Signed)
Neurology Consultation  Reason for Consult: AMS, stroke on MRI Referring Physician:  Dr Renae Gloss  CC: Altered mental status  History is obtained from: Patient's daughter over the phone, patient's granddaughter at bedside, chart  HPI: Shelly Parks is a 81 y.o. female past medical history of COPD, diabetes, hypertension, hyperlipidemia, history of depression, presented to the emergency room for evaluation of altered mental status and elevated blood pressures.  Family reports change in her mentation over the past few weeks to months with no clear last known well where she had a sudden change. The family reports that she had been having elevated blood pressures for which she was sent to the emergency room in the late part of August, was treated in the ER and sent home but continued to be not behaving like herself-specific instances brought forward by the family are her inability to keep her room organized as well as her interactions with the other family members that had become somewhat not amiable because she snapped at them when they wanted to correct her on certain things. The daughter specifically reports that the patient went into her room and possibly broke some of her candles which is out of character for her.  She also reported that the patient was trying to wash her disposable adult diapers in the washer where at baseline she definitely knows that the disposable diapers are not washable. Work-up in the hospital was initiated.  Preliminary labs were remarkable for hypokalemia which was being treated.  Her vitals did reveal hypertension which is also being treated.  Brain imaging was performed because of the cognitive and personality changes and it revealed a left parietal occipital subacute appearing infarct.  Neurological consultation has been obtained for further work-up and evaluation. Patient was not able to provide reliable history. Patient's granddaughter at bedside reports that the patient  has been having trouble remembering her birthday and age in spite of being repeatedly told over the past few months.  They do not know if there are any family members with history of dementia.    LKW: Unclear IV thrombolysis given?: no, unclear last known well, nonfocal symptoms Premorbid modified Rankin scale (mRS): 0  ROS: Full ROS was performed and is negative except as noted in the HPI-obtained from family  Past Medical History:  Diagnosis Date   Anemia    Asthma    COPD (chronic obstructive pulmonary disease) (HCC)    Diabetes mellitus without complication (HCC)    HLD (hyperlipidemia)    HTN (hypertension), benign    Incontinence of urine in female    Major depressive disorder, single episode, unspecified    Reflux     Family History  Problem Relation Age of Onset   Cancer Father    Hypertension Mother    Cancer Brother    Hypertension Sister    Bladder Cancer Sister     Social History:   reports that she quit smoking about 51 years ago. She has never used smokeless tobacco. She reports that she does not drink alcohol and does not use drugs.  Medications  Current Facility-Administered Medications:    [START ON 03/12/2022]  stroke: early stages of recovery book, , Does not apply, Once, Milon Dikes, MD   acetaminophen (TYLENOL) tablet 650 mg, 650 mg, Oral, Q6H PRN **OR** acetaminophen (TYLENOL) suppository 650 mg, 650 mg, Rectal, Q6H PRN, Bobette Mo, MD   albuterol (PROVENTIL) (2.5 MG/3ML) 0.083% nebulizer solution 3 mL, 3 mL, Inhalation, Q6H PRN, Bobette Mo, MD  amLODipine (NORVASC) tablet 10 mg, 10 mg, Oral, Daily, Bobette Mo, MD   [START ON 03/12/2022] aspirin EC tablet 81 mg, 81 mg, Oral, Daily, Renae Gloss, Richard, MD   atorvastatin (LIPITOR) tablet 40 mg, 40 mg, Oral, QPM, Wieting, Richard, MD   clopidogrel (PLAVIX) tablet 75 mg, 75 mg, Oral, Daily, Renae Gloss, Richard, MD   enoxaparin (LOVENOX) injection 40 mg, 40 mg, Subcutaneous, Q24H,  Bobette Mo, MD, 40 mg at 03/10/22 2113   haloperidol lactate (HALDOL) injection 2.5 mg, 2.5 mg, Intravenous, Q6H PRN, Bobette Mo, MD   hydrALAZINE (APRESOLINE) injection 10 mg, 10 mg, Intravenous, Q4H PRN, Bobette Mo, MD   insulin aspart (novoLOG) injection 0-9 Units, 0-9 Units, Subcutaneous, TID WC, Bobette Mo, MD   LORazepam (ATIVAN) injection 0.5 mg, 0.5 mg, Intravenous, Once PRN, Bobette Mo, MD   mometasone-formoterol [facility]) 100-5 MCG/ACT inhaler 2 puff, 2 puff, Inhalation, BID, Bobette Mo, MD, 2 puff at 03/11/22 0854   ondansetron (ZOFRAN) tablet 4 mg, 4 mg, Oral, Q6H PRN **OR** ondansetron (ZOFRAN) injection 4 mg, 4 mg, Intravenous, Q6H PRN, Bobette Mo, MD   pantoprazole (PROTONIX) EC tablet 40 mg, 40 mg, Oral, Daily, Bobette Mo, MD, 40 mg at 03/10/22 1755  Exam: Current vital signs: BP (!) 143/64 (BP Location: Right Arm)   Pulse 88   Temp 97.9 F (36.6 C)   Resp 16   Ht 5\' 3"  (1.6 m)   Wt 58 kg   SpO2 100%   BMI 22.65 kg/m  Vital signs in last 24 hours: Temp:  [97.9 F (36.6 C)-98.6 F (37 C)] 97.9 F (36.6 C) (09/05 0908) Pulse Rate:  [74-96] 88 (09/05 0908) Resp:  [16-18] 16 (09/04 1928) BP: (117-168)/(58-78) 143/64 (09/05 0908) SpO2:  [97 %-100 %] 100 % (09/05 0908) General: She is awake alert, very pleasant, in no distress HEENT: Normocephalic atraumatic Lungs: Clear Cardiovascular: Regular rhythm Abdomen nontender nondistended Extremities warm well perfused with multiple areas of hypopigmentation which are unchanged according to family. Neurological exam Awake alert oriented to self Oriented to the fact that she is not at home but not very clear on where she is. Unable to tell me her age or even her date of birth. She was able to name simple objects with some word substitutions She was able to repeat Attention concentration is poor Cranial nerves II to XII intact Motor examination with no  drift in any fours Sensation intact to light touch without extinction Coordination with no dysmetria NIH stroke scale 1a Level of Conscious.: 0 1b LOC Questions: 2 1c LOC Commands: 0 2 Best Gaze: 0 3 Visual: 0 4 Facial Palsy: 0 5a Motor Arm - left: 0 5b Motor Arm - Right: 0 6a Motor Leg - Left: 0 6b Motor Leg - Right: 0 7 Limb Ataxia: 0 8 Sensory: 0 9 Best Language: 1 10 Dysarthria: 0 11 Extinct. and Inatten.: 0 TOTAL: 3   Labs I have reviewed labs in epic and the results pertinent to this consultation are:  CBC    Component Value Date/Time   WBC 4.8 03/10/2022 1022   RBC 4.22 03/10/2022 1022   HGB 12.5 03/10/2022 1022   HCT 38.2 03/10/2022 1022   PLT 379 03/10/2022 1022   MCV 90.5 03/10/2022 1022   MCH 29.6 03/10/2022 1022   MCHC 32.7 03/10/2022 1022   RDW 12.2 03/10/2022 1022   LYMPHSABS 1.1 03/10/2022 1022   MONOABS 0.5 03/10/2022 1022   EOSABS 0.0 03/10/2022 1022  BASOSABS 0.0 03/10/2022 1022    CMP     Component Value Date/Time   NA 138 03/11/2022 0430   K 3.6 03/11/2022 0430   CL 103 03/11/2022 0430   CO2 28 03/11/2022 0430   GLUCOSE 103 (H) 03/11/2022 0430   BUN 23 03/11/2022 0430   CREATININE 0.96 03/11/2022 0430   CALCIUM 10.0 03/11/2022 0430   PROT 7.8 03/10/2022 1022   ALBUMIN 4.3 03/10/2022 1022   AST 21 03/10/2022 1022   ALT 15 03/10/2022 1022   ALKPHOS 58 03/10/2022 1022   BILITOT 0.9 03/10/2022 1022   GFRNONAA 59 (L) 03/11/2022 0430    Lipid Panel     Component Value Date/Time   CHOL 221 (H) 03/11/2022 0430   TRIG 69 03/11/2022 0430   HDL 70 03/11/2022 0430   CHOLHDL 3.2 03/11/2022 0430   VLDL 14 03/11/2022 0430   LDLCALC 137 (H) 03/11/2022 0430   LDL 137 A1c 5.6  Imaging I have reviewed the images obtained:  CT-head: No acute pathology.  Several chronic small vessel ischemic changes with age indeterminate lacunar infarct in bilateral thalami.  MRI examination of the brain: Patchy small volume acute infarct involving the  left temporal occipital region.  Assessment: 81 year old with complaints of altered mental status that has been ongoing for a couple of months with an acute concern for increasing confusion brought in for evaluation and noted to have a left temporal occipital stroke-likely subacute. Needs full stroke work-up. Regarding her mentation and cognitive changes, she might have some underlying cognitive deficits which have been likely unmasked by the acute stroke. On examination, she has been displaying poor attention concentration as well as some word finding difficulty-in addition to not being able to answer the orientation questions appropriately.  She has no cranial nerve, motor or sensory deficits at this time.  Impression: Acute ischemic stroke-evaluate for underlying etiology which could be cardioembolic versus atheroembolic  Recommendations: Telemetry Frequent neurochecks CTA head and neck 2D echo A1c at goal Lipid panel revealed an LDL of 137.  High intensity statin atorvastatin 80 mg now and daily for goal LDL of less than 70. Aspirin 81 Plavix 75-duration to be determined after vessel imaging. No need for permissive HTN due to the fact that we have no known last well and symptoms ongoing for a few days to weeks. PT OT ST If all work-up remains unrevealing, may need long-term outpatient cardiac monitoring.  I will update the primary hospitalist once the above are completed. Plan discussed with Dr. Renae Gloss   -- Milon Dikes, MD Neurologist [facility] Pager: [phone]

## 2022-03-11 NOTE — NC FL2 (Addendum)
[place] MEDICAID FL2 LEVEL OF CARE SCREENING TOOL     IDENTIFICATION  Patient Name: Shelly Parks Birthdate: 19-May-1941 Sex: female Admission Date (Current Location): 03/10/2022  [facility] and [place] Number:  Chiropodist and Address:  [facility], [address] [place], [place] [address]      Provider Number: [id number]  Attending Physician Name and Address:  Alford Highland, MD  Relative Name and Phone Number:  Amy [phone]    Current Level of Care: Hospital Recommended Level of Care: Skilled Nursing Facility Prior Approval Number:    Date Approved/Denied:   PASRR Number: [id number]  Discharge Plan: SNF    Current Diagnoses: Patient Active Problem List   Diagnosis Date Noted   Acute encephalopathy 03/10/2022   Hypokalemia 03/10/2022   Memory changes 03/10/2022   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   HLD (hyperlipidemia)    HTN (hypertension), benign    Anemia    Major depressive disorder, single episode, unspecified    Reflux    COPD (chronic obstructive pulmonary disease) (HCC)    Incontinence of urine in female    Diabetes mellitus without complication (HCC)     Orientation RESPIRATION BLADDER Height & Weight     Self  Normal Continent Weight: 58 kg Height:  5\' 3"  (160 cm)  BEHAVIORAL SYMPTOMS/MOOD NEUROLOGICAL BOWEL NUTRITION STATUS      Continent Diet (see dc summary)  AMBULATORY STATUS COMMUNICATION OF NEEDS Skin   Extensive Assist Verbally Normal                       Personal Care Assistance Level of Assistance  Bathing, Feeding, Dressing Bathing Assistance: Limited assistance Feeding assistance: Independent Dressing Assistance: Maximum assistance     Functional Limitations Info             SPECIAL CARE FACTORS FREQUENCY  PT (By licensed PT), OT (By licensed OT)     PT Frequency: 5 times a week OT Frequency: 5 times per week            Contractures Contractures Info: Not present     Additional Factors Info  Code Status, Allergies Code Status Info: Full code Allergies Info: NKDA           Current Medications (03/11/2022):  This is the current hospital active medication list Current Facility-Administered Medications  Medication Dose Route Frequency Provider Last Rate Last Admin   [START ON 03/12/2022]  stroke: early stages of recovery book   Does not apply Once 05/12/2022, MD       acetaminophen (TYLENOL) tablet 650 mg  650 mg Oral Q6H PRN Milon Dikes, MD       Or   acetaminophen (TYLENOL) suppository 650 mg  650 mg Rectal Q6H PRN Bobette Mo, MD       albuterol (PROVENTIL) (2.5 MG/3ML) 0.083% nebulizer solution 3 mL  3 mL Inhalation Q6H PRN Bobette Mo, MD       amLODipine (NORVASC) tablet 10 mg  10 mg Oral Daily Bobette Mo, MD   10 mg at 03/11/22 1138   [START ON 03/12/2022] aspirin EC tablet 81 mg  81 mg Oral Daily Wieting, Richard, MD       atorvastatin (LIPITOR) tablet 40 mg  40 mg Oral QPM Wieting, Richard, MD       clopidogrel (PLAVIX) tablet 75 mg  75 mg Oral Daily 05/12/2022, MD   75 mg at 03/11/22 1138   enoxaparin (  LOVENOX) injection 40 mg  40 mg Subcutaneous Q24H Bobette Mo, MD   40 mg at 03/10/22 2113   haloperidol lactate (HALDOL) injection 2.5 mg  2.5 mg Intravenous Q6H PRN Bobette Mo, MD       hydrALAZINE (APRESOLINE) injection 10 mg  10 mg Intravenous Q4H PRN Bobette Mo, MD       insulin aspart (novoLOG) injection 0-9 Units  0-9 Units Subcutaneous TID WC Bobette Mo, MD       LORazepam (ATIVAN) injection 0.5 mg  0.5 mg Intravenous Once PRN Bobette Mo, MD       mometasone-formoterol [facility]) 100-5 MCG/ACT inhaler 2 puff  2 puff Inhalation BID Bobette Mo, MD   2 puff at 03/11/22 0854   ondansetron [facility]) tablet 4 mg  4 mg Oral Q6H PRN Bobette Mo, MD       Or   ondansetron [facility]) injection 4 mg  4 mg Intravenous Q6H PRN Bobette Mo, MD        pantoprazole (PROTONIX) EC tablet 40 mg  40 mg Oral Daily Bobette Mo, MD   40 mg at 03/11/22 1138     Discharge Medications: Please see discharge summary for a list of discharge medications.  Relevant Imaging Results:  Relevant Lab Results:   Additional Information SS# [id number]  Marlowe Sax, RN

## 2022-03-11 NOTE — Progress Notes (Signed)
Went to patient room to start PIV, Echo at bedside, IV team will come back.

## 2022-03-12 DIAGNOSIS — J449 Chronic obstructive pulmonary disease, unspecified: Secondary | ICD-10-CM

## 2022-03-12 DIAGNOSIS — I639 Cerebral infarction, unspecified: Secondary | ICD-10-CM | POA: Diagnosis not present

## 2022-03-12 DIAGNOSIS — G9341 Metabolic encephalopathy: Secondary | ICD-10-CM | POA: Diagnosis not present

## 2022-03-12 LAB — RPR: RPR Ser Ql: NONREACTIVE

## 2022-03-12 MED ORDER — ATORVASTATIN CALCIUM 40 MG PO TABS: 40.0000 mg | ORAL_TABLET | Freq: Every evening | ORAL | 0 refills | Status: DC

## 2022-03-12 MED ORDER — ATORVASTATIN CALCIUM 80 MG PO TABS: 80.0000 mg | ORAL_TABLET | Freq: Every day | ORAL | 0 refills | Status: DC

## 2022-03-12 MED ORDER — ASPIRIN 81 MG PO TBEC: 81.0000 mg | DELAYED_RELEASE_TABLET | Freq: Every day | ORAL | 0 refills | Status: AC

## 2022-03-12 MED ORDER — PANTOPRAZOLE SODIUM 40 MG PO TBEC: 40.0000 mg | DELAYED_RELEASE_TABLET | Freq: Every day | ORAL | 0 refills | Status: DC

## 2022-03-12 MED ORDER — CLOPIDOGREL BISULFATE 75 MG PO TABS: 75.0000 mg | ORAL_TABLET | Freq: Every day | ORAL | 0 refills | Status: AC

## 2022-03-12 NOTE — TOC Progression Note (Addendum)
Transition of Care [facility]) - Progression Note    Patient Details  Name: Shelly Parks MRN: [id number] Date of Birth: 01/13/1941  Transition of Care [facility] CM/SW Rowe, RN Phone Number: 03/12/2022, 10:43 AM  Clinical Narrative:    Met with the patient and her daughter in the room, the patient does not mneet criteria to go to STR, I explained that she needs more of a long term care or memory care, I explained that insurance does not cover either, the patient has medicaid she reports, I encouraged her to call to get set up with the CAP program. she reports that he patient does not need DME She has a cousin and aunt coming in today to help and asks to be discharged home today, I notified the doctor She reports that due to her work schedule that [facility] would not be feesible  She asked about FMLA, I explained that the patient's PCP would need to fill out paperwork to take to the employer, She stated that she has no other needs, Cardiac Monitor to be set up by Cardio   Expected Discharge Plan: Skilled Nursing Facility Barriers to Discharge: SNF Pending bed offer, Insurance Authorization  Expected Discharge Plan and Services Expected Discharge Plan: [place]                                               Social Determinants of Health (SDOH) Interventions    Readmission Risk Interventions     No data to display

## 2022-03-12 NOTE — Progress Notes (Signed)
Occupational Therapy Treatment Patient Details Name: Shelly Parks MRN: [id number] DOB: Nov 30, 1940 Today's Date: 03/12/2022   History of present illness ADRIEL Parks is a 81 y.o. female with medical history significant of anemia, asthma, COPD, type II DM, hyperlipidemia, hypertension, incontinence, major depression, GERD who is brought by her daughter to the emergency department due to worsening confusion that has been getting progressively worse over the last few months.   OT comments  Upon entering the room, pt supine in bed with no c/o pain and daughter present in room. Pt is pleasantly confused and follows commands with increased time. Pt performs bed mobility without assistance and sits on EOB for medication management tasks. Pt's daughter, caregiver, is present in room and reports pt was utilizing a pill box until recently when she began having to give pt medications and observe her taking them for safety. Pt needing cuing for each step of medication management and would not be able to perform any part of this tasks without assistance at home. Pt stands at sink to doff dentures and perform oral care with mod multimodal cuing of short commands. Pt returning to sit on EOB and transitions to speech therapy session. OT spent some time educating and discussing with family about recommendations for 24/7 supervision at home for safety. She verbalized understanding.    Recommendations for follow up therapy are one component of a multi-disciplinary discharge planning process, led by the attending physician.  Recommendations may be updated based on patient status, additional functional criteria and insurance authorization.    Follow Up Recommendations  No OT follow up    Assistance Recommended at Discharge Frequent or constant Supervision/Assistance  Patient can return home with the following  Assist for transportation;Assistance with cooking/housework;Direct supervision/assist for financial  management;Direct supervision/assist for medications management   Equipment Recommendations  None recommended by OT       Precautions / Restrictions Precautions Precautions: Fall       Mobility Bed Mobility Overal bed mobility: Modified Independent                  Transfers   Equipment used: None                     Balance Overall balance assessment: Modified Independent                                         ADL either performed or assessed with clinical judgement   ADL Overall ADL's : Needs assistance/impaired                                       General ADL Comments: supervision for safety awareness secondary to decreased cognition    Extremity/Trunk Assessment Upper Extremity Assessment Upper Extremity Assessment: Overall WFL for tasks assessed   Lower Extremity Assessment Lower Extremity Assessment: Overall WFL for tasks assessed        Vision Patient Visual Report: No change from baseline            Cognition Arousal/Alertness: Awake/alert Behavior During Therapy: WFL for tasks assessed/performed Overall Cognitive Status: Impaired/Different from baseline Area of Impairment: Orientation, Problem solving, Safety/judgement, Awareness, Memory, Following commands, Attention                 Orientation Level: Person Current  Attention Level: Focused Memory: Decreased short-term memory Following Commands: Follows one step commands consistently, Follows one step commands with increased time Safety/Judgement: Decreased awareness of safety, Decreased awareness of deficits Awareness: Intellectual Problem Solving: Slow processing, Difficulty sequencing, Requires verbal cues, Requires tactile cues, Decreased initiation General Comments: Pt is pleasantly confused and needing increased cuing for initiation and sequencing of tasks.                   Pertinent Vitals/ Pain       Pain Assessment Pain  Assessment: No/denies pain         Frequency  Min 2X/week        Progress Toward Goals  OT Goals(current goals can now be found in the care plan section)  Progress towards OT goals: Progressing toward goals  Acute Rehab OT Goals Patient Stated Goal: to return home OT Goal Formulation: With patient/family Time For Goal Achievement: 03/25/22 Potential to Achieve Goals: Fair  Plan Frequency remains appropriate;Discharge plan needs to be updated       AM-PAC OT "6 Clicks" Daily Activity     Outcome Measure   Help from another person eating meals?: None Help from another person taking care of personal grooming?: None Help from another person toileting, which includes using toliet, bedpan, or urinal?: A Little Help from another person bathing (including washing, rinsing, drying)?: A Little Help from another person to put on and taking off regular upper body clothing?: None Help from another person to put on and taking off regular lower body clothing?: A Little 6 Click Score: 21    End of Session    OT Visit Diagnosis: Other symptoms and signs involving cognitive function   Activity Tolerance Patient tolerated treatment well   Patient Left in bed;with call bell/phone within reach;with bed alarm set;with family/visitor present;Other (comment) (speech therapist present)   Nurse Communication Mobility status        Time: [phone] OT Time Calculation (min): 24 min  Charges: OT General Charges $OT Visit: 1 Visit OT Treatments $Self Care/Home Management : 8-22 mins $Therapeutic Activity: 8-22 mins  Jackquline Denmark, MS, OTR/L , CBIS ascom [phone]  03/12/22, 11:25 AM

## 2022-03-12 NOTE — Discharge Summary (Signed)
Physician Discharge Summary   Patient: Shelly Parks MRN: [id number] DOB: [birth year]  Admit date:     03/10/2022  Discharge date: 03/12/22  Discharge Physician: Sharen Hones   PCP: Coolidge Breeze, FNP   Recommendations at discharge:   Follow-up with PCP in 1 week. Follow-up with neurology in 2 weeks. Follow-up with Dr. Jose Persia in [facility] cardiology to set up a ZIO recorder.  Discharge Diagnoses: Principal Problem:   Acute stroke due to ischemia [facility]) Active Problems:   Acute encephalopathy   Acute metabolic encephalopathy   Essential hypertension   Type 2 diabetes mellitus with hyperlipidemia (HCC)   HLD (hyperlipidemia)   COPD (chronic obstructive pulmonary disease) (HCC)   Diabetes mellitus without complication (HCC)   Hypokalemia   Memory changes   Lacunar infarction ([place])   Acute CVA (cerebrovascular accident) ([place])   GERD (gastroesophageal reflux disease)  Resolved Problems:   * No resolved hospital problems. *  Hospital Course: 81 year old female with past medical history of asthma/COPD, type 2 diabetes, hyperlipidemia, hypertension, depression, GERD and anemia.  Patient is brought in by family with altered mental status and high blood pressure readings.  MRI showing patchy small acute ischemic nonhemorrhagic left infarct involving the left temporal occipital region.  Patient was started on aspirin Plavix and Lipitor. Patient is seen by neurology, recommended high-dose of atorvastatin 80 mg daily, aspirin 81 mg daily.  Plavix 75-minute daily x3 weeks. Patient condition had improved, currently medically stable to be discharged.  Patient has a significant cognitive impairment, does not qualify for inpatient rehab.  We will set up PT/OT/RN/aids at time of discharge.  Patient be also followed by PCP and neurology. Also contacted Cardiology to Set up a [place].  Assessment and Plan: * Acute stroke due to ischemia [facility] Cognitive deficit with possible dementia. MRI showing  acute stroke in the left temporal occipital area.  Patient's main deficit is cognitive, she had confusion at home.  I started aspirin and Plavix this morning.  Pravastatin switched to Lipitor, LDL 137.  CT angio head and neck doubt significant occlusion.  Echocardiogram shows a EF above normal range. Patient will be discharged in stable condition.  Follow-up with cardiology for ZIO recorder.   Acute metabolic encephalopathy Secondary to stroke.  TSH and B12 normal range.  Patient appears to be back to baseline with cognitive deficits.   Type 2 diabetes mellitus with hyperlipidemia (HCC) Hemoglobin A1c actually low at 5.6.    Essential hypertension Regular restart home medicines, continue hold HCTZ.   GERD (gastroesophageal reflux disease) Continue PPI, changed to Protonix to avoid interaction with Plavix.   COPD (chronic obstructive pulmonary disease) ([place]) Zoom home medicines.       Consultants: Neurology. Procedures performed: None Disposition: Home health Diet recommendation:  Discharge Diet Orders (From admission, onward)     Start     Ordered   03/12/22 0000  Diet - low sodium heart healthy        03/12/22 1052           Cardiac diet DISCHARGE MEDICATION: Allergies as of 03/12/2022   No Known Allergies      Medication List     STOP taking these medications    hydrochlorothiazide 25 MG tablet Commonly known as: HYDRODIURIL   omeprazole 40 MG capsule Commonly known as: PRILOSEC Replaced by: pantoprazole 40 MG tablet   pravastatin 40 MG tablet Commonly known as: PRAVACHOL       TAKE these medications    Advair Diskus  100-50 MCG/DOSE Aepb Generic drug: Fluticasone-Salmeterol Inhale 1 puff into the lungs 2 (two) times daily.   albuterol 108 (90 Base) MCG/ACT inhaler Commonly known as: VENTOLIN HFA Inhale into the lungs every 6 (six) hours as needed for wheezing or shortness of breath.   amLODipine 10 MG tablet Commonly known as: NORVASC Take 10  mg by mouth daily.   aspirin EC 81 MG tablet Take 1 tablet (81 mg total) by mouth daily. Swallow whole. Start taking on: March 13, 2022   atorvastatin 80 MG tablet Commonly known as: Lipitor Take 1 tablet (80 mg total) by mouth daily.   CALCIUM 500 PO Take 1 tablet by mouth.   clopidogrel 75 MG tablet Commonly known as: PLAVIX Take 1 tablet (75 mg total) by mouth daily for 21 days. Start taking on: March 13, 2022   lisinopril 40 MG tablet Commonly known as: ZESTRIL Take 40 mg by mouth daily.   mirabegron ER 50 MG Tb24 tablet Commonly known as: MYRBETRIQ Take 1 tablet (50 mg total) by mouth daily.   pantoprazole 40 MG tablet Commonly known as: PROTONIX Take 1 tablet (40 mg total) by mouth daily. Start taking on: March 13, 2022 Replaces: omeprazole 40 MG capsule   venlafaxine XR 37.5 MG 24 hr capsule Commonly known as: EFFEXOR-XR Take 37.5 mg by mouth daily with breakfast.   VITRON-C PO Take 1 tablet by mouth.        Follow-up Information     Coolidge Breeze, FNP Follow up in 1 week(s).   Specialty: Family Medicine Contact information: [address] [place] [address] [phone]         [facility] Follow up in 2 week(s).   Contact information: [place] [phone]        Coolidge Breeze, FNP Follow up.   Specialty: Family Medicine Contact information: [address] [place] [address] [phone]         Corey Skains, MD Follow up in 1 week(s).   Specialty: Cardiology Why: set up [place] information: [address] [facility] [place] [address] [phone]         [facility] Follow up in 2 week(s).   Contact information: [place] [phone]               Discharge Exam: Danley Danker Weights   03/10/22 0947  03/10/22 1003  Weight: 65.8 kg 58 kg   General exam: Appears calm and comfortable  Respiratory system: Clear to auscultation. Respiratory effort normal. Cardiovascular system: S1 & S2 heard, RRR. No JVD, murmurs, rubs, gallops or clicks. No pedal edema. Gastrointestinal system: Abdomen is nondistended, soft and nontender. No organomegaly or masses felt. Normal bowel sounds heard. Central nervous system: Alert and oriented x1. No focal neurological deficits. Extremities: Symmetric 5 x 5 power. Skin: No rashes, lesions or ulcers Psychiatry: Pleasant  Condition at discharge: fair  The results of significant diagnostics from this hospitalization (including imaging, microbiology, ancillary and laboratory) are listed below for reference.   Imaging Studies: CT ANGIO HEAD NECK W WO CM  Result Date: 03/11/2022 CLINICAL DATA:  Stroke suspected, worsening confusion EXAM: CT ANGIOGRAPHY HEAD AND NECK TECHNIQUE: Multidetector CT imaging of the head and neck was performed using the standard protocol during bolus administration of intravenous contrast. Multiplanar CT image reconstructions and MIPs were obtained to evaluate the vascular  anatomy. Carotid stenosis measurements (when applicable) are obtained utilizing NASCET criteria, using the distal internal carotid diameter as the denominator. RADIATION DOSE REDUCTION: This exam was performed according to the departmental dose-optimization program which includes automated exposure control, adjustment of the mA and/or kV according to patient size and/or use of iterative reconstruction technique. CONTRAST:  54mL OMNIPAQUE IOHEXOL 350 MG/ML SOLN COMPARISON:  No prior CTA, correlation is made with 03/10/2022 CT head and MRI head FINDINGS: CT HEAD FINDINGS Brain: Some hypodensity in the left posterior temporal and occipital lobe may correlate with the acute infarcts seen on 03/10/2022. No evidence of additional acute infarct, hemorrhage, mass, mass effect, or midline  shift. No hydrocephalus or extra-axial fluid collection. Periventricular white matter changes, likely the sequela of chronic small vessel ischemic disease. Vascular: No hyperdense vessel. Skull: Normal. Negative for fracture or focal lesion. Sinuses/Orbits: No acute finding. Other: The mastoid air cells are well aerated. CTA NECK FINDINGS Aortic arch: Standard branching. Imaged portion shows no evidence of aneurysm or dissection. No significant stenosis of the major arch vessel origins. Aortic atherosclerosis. Right carotid system: No evidence of dissection, occlusion, or hemodynamically significant stenosis (greater than 50%). Left carotid system: No evidence of dissection, occlusion, or hemodynamically significant stenosis (greater than 50%). Vertebral arteries: The right vertebral artery is diminutive from its origin to the skull base. Left dominant system. The left vertebral artery is patent from its origin to the skull base. No evidence of dissection, occlusion, or hemodynamically significant stenosis (greater than 50%). Skeleton: No acute osseous abnormality.  Edentulous. Other neck: Subcentimeter hypoenhancing nodules in the thyroid, for which no follow-up is indicated. (Reference: J Am Coll Radiol. 2015 Feb;12(2): 143-50) Upper chest: No focal pulmonary opacity or pleural effusion. Review of the MIP images confirms the above findings CTA HEAD FINDINGS Anterior circulation: Both internal carotid arteries are patent to the termini, with scattered calcifications but without significant stenosis. A1 segments patent. Normal anterior communicating artery. Anterior cerebral arteries are patent to their distal aspects. No M1 stenosis or occlusion. MCA branches perfused and symmetric. Posterior circulation: The right vertebral artery is diminutive and poorly visualized distally. The left vertebral artery is patent to the vertebrobasilar junction without significant stenosis. Picas are not visualized bilaterally  however there are prominent AICA. Basilar patent to its distal aspect. Superior cerebellar arteries patent proximally. Patent left P1. Likely diminutive right P1 with prominent right posterior communicating artery and near fetal origin of the right PCA. PCAs perfused to their distal aspects without stenosis. The left posterior communicating artery is not visualized. Venous sinuses: As permitted by contrast timing, patent. Anatomic variants: Near fetal origin of the right PCA. Review of the MIP images confirms the above findings IMPRESSION: 1. No acute intracranial process. Some hypodensity in the left temporal and occipital lobes may correlate with the known acute infarcts seen on 03/10/2022. 2.  No intracranial large vessel occlusion or significant stenosis. 3.  No hemodynamically significant stenosis in the neck. Electronically Signed   By: Wiliam Ke M.D.   On: 03/11/2022 20:10   ECHOCARDIOGRAM COMPLETE  Result Date: 03/11/2022    ECHOCARDIOGRAM REPORT   Patient Name:   AURIA MCKINLAY Date of Exam: 03/11/2022 Medical Rec #:  [id number]    Height:       63.0 in Accession #:    [id number]   Weight:       127.9 lb Date of Birth:  1941/01/20     BSA:          1.599  m Patient Age:    60 years     BP:           117/58 mmHg Patient Gender: F            HR:           85 bpm. Exam Location:  [facility] Procedure: 2D Echo, Cardiac Doppler and Color Doppler Indications:     I63.9 Stroke  History:         Patient has no prior history of Echocardiogram examinations.                  COPD; Risk Factors:Former Smoker, Dyslipidemia, Hypertension                  and Diabetes.  Sonographer:     Rosalia Hammers Referring Phys:  [id number] Loletha Grayer Diagnosing Phys: Nelva Bush MD  Sonographer Comments: Suboptimal parasternal window and suboptimal subcostal window. Image acquisition challenging due to patient body habitus and Image acquisition challenging due to COPD. IMPRESSIONS  1. Left ventricular ejection fraction, by estimation,  is >75%. The left ventricle has hyperdynamic function. The left ventricle has no regional wall motion abnormalities. There is mild left ventricular hypertrophy. Left ventricular diastolic parameters are consistent with Grade I diastolic dysfunction (impaired relaxation).  2. Right ventricular systolic function is normal. The right ventricular size is normal.  3. The mitral valve is normal in structure. Trivial mitral valve regurgitation. No evidence of mitral stenosis.  4. The aortic valve was not well visualized. Aortic valve regurgitation is not visualized. No aortic stenosis is present.  5. The inferior vena cava is normal in size with greater than 50% respiratory variability, suggesting right atrial pressure of 3 mmHg. FINDINGS  Left Ventricle: Left ventricular ejection fraction, by estimation, is >75%. The left ventricle has hyperdynamic function. The left ventricle has no regional wall motion abnormalities. The left ventricular internal cavity size was normal in size. There is mild left ventricular hypertrophy. Left ventricular diastolic parameters are consistent with Grade I diastolic dysfunction (impaired relaxation). Right Ventricle: The right ventricular size is normal. Right vetricular wall thickness was not well visualized. Right ventricular systolic function is normal. Left Atrium: Left atrial size was normal in size. Right Atrium: Right atrial size was normal in size. Pericardium: There is no evidence of pericardial effusion. Mitral Valve: The mitral valve is normal in structure. Trivial mitral valve regurgitation. No evidence of mitral valve stenosis. Tricuspid Valve: The tricuspid valve is grossly normal. Tricuspid valve regurgitation is trivial. Aortic Valve: The aortic valve was not well visualized. Aortic valve regurgitation is not visualized. No aortic stenosis is present. Aortic valve mean gradient measures 5.0 mmHg. Aortic valve peak gradient measures 8.6 mmHg. Aortic valve area, by VTI  measures 2.15 cm. Pulmonic Valve: The pulmonic valve was not well visualized. Pulmonic valve regurgitation is not visualized. No evidence of pulmonic stenosis. Aorta: The aortic root is normal in size and structure. Pulmonary Artery: The pulmonary artery is not well seen. Venous: The inferior vena cava is normal in size with greater than 50% respiratory variability, suggesting right atrial pressure of 3 mmHg. IAS/Shunts: The interatrial septum was not well visualized.  LEFT VENTRICLE PLAX 2D LVIDd:         3.35 cm   Diastology LVIDs:         1.78 cm   LV e' medial:    7.07 cm/s LV PW:         1.25 cm   LV E/e'  medial:  9.5 LV IVS:        1.26 cm   LV e' lateral:   7.94 cm/s LVOT diam:     1.70 cm   LV E/e' lateral: 8.5 LV SV:         64 LV SV Index:   40 LVOT Area:     2.27 cm  RIGHT VENTRICLE RV Basal diam:  2.63 cm RV S prime:     16.90 cm/s TAPSE (M-mode): 2.0 cm LEFT ATRIUM             Index        RIGHT ATRIUM           Index LA diam:        2.40 cm 1.50 cm/m   RA Area:     11.20 cm LA Vol (A2C):   34.6 ml 21.64 ml/m  RA Volume:   28.10 ml  17.58 ml/m LA Vol (A4C):   35.0 ml 21.89 ml/m LA Biplane Vol: 36.5 ml 22.83 ml/m  AORTIC VALVE AV Area (Vmax):    2.10 cm AV Area (Vmean):   2.07 cm AV Area (VTI):     2.15 cm AV Vmax:           147.00 cm/s AV Vmean:          103.000 cm/s AV VTI:            0.295 m AV Peak Grad:      8.6 mmHg AV Mean Grad:      5.0 mmHg LVOT Vmax:         136.00 cm/s LVOT Vmean:        93.900 cm/s LVOT VTI:          0.280 m LVOT/AV VTI ratio: 0.95  AORTA Ao Root diam: 2.70 cm MITRAL VALVE MV Area (PHT): 2.83 cm    SHUNTS MV Decel Time: 268 msec    Systemic VTI:  0.28 m MV E velocity: 67.30 cm/s  Systemic Diam: 1.70 cm MV A velocity: 82.30 cm/s MV E/A ratio:  0.82 Christopher End MD Electronically signed by Yvonne Kendall MD Signature Date/Time: 03/11/2022/1:45:39 PM    Final    MR BRAIN WO CONTRAST  Result Date: 03/11/2022 CLINICAL DATA:  Initial evaluation for mental status  change, unknown cause. EXAM: MRI HEAD WITHOUT CONTRAST TECHNIQUE: Multiplanar, multiecho pulse sequences of the brain and surrounding structures were obtained without intravenous contrast. COMPARISON:  Prior CT from earlier the same day. FINDINGS: Brain: Examination moderately degraded by motion artifact. Generalized age-related cerebral atrophy. Patchy and confluent T2/FLAIR hyperintensity involving the periventricular deep white matter both cerebral hemispheres, with patchy involvement of the deep gray nuclei and pons. Findings most consistent with chronic microvascular ischemic disease, fairly advanced in nature. Patchy small volume foci of restricted diffusion involving the cortical/subcortical aspect of the left temporal occipital region (series 3, images 75, 72), consistent with a small acute ischemic infarct. No associated hemorrhage or significant mass effect. No other evidence for acute or subacute ischemia. Gray-white matter differentiation otherwise maintained. No acute or chronic intracranial blood products. No mass lesion, midline shift or mass effect. Mild ventricular prominence related global parenchymal volume loss without hydrocephalus. No extra-axial fluid collection. Pituitary gland and suprasellar region grossly within normal limits on this motion degraded exam. Vascular: Major intracranial vascular flow voids are grossly maintained at the skull base. Skull and upper cervical spine: Craniocervical junction within normal limits. Bone marrow signal intensity grossly normal. No scalp soft tissue  abnormality Sinuses/Orbits: Globes orbital soft tissues within normal limits. Paranasal sinuses are largely clear. Small left mastoid effusion noted, of doubtful significance. Other: None. IMPRESSION: 1. Patchy small volume acute ischemic nonhemorrhagic left infarct involving the left temporoccipital region. 2. Age-related cerebral atrophy with advanced chronic microvascular ischemic disease. Electronically  Signed   By: Jeannine Boga M.D.   On: 03/11/2022 00:43   CT HEAD WO CONTRAST (5MM)  Result Date: 03/10/2022 CLINICAL DATA:  Mental status change, unknown cause. EXAM: CT HEAD WITHOUT CONTRAST TECHNIQUE: Contiguous axial images were obtained from the base of the skull through the vertex without intravenous contrast. RADIATION DOSE REDUCTION: This exam was performed according to the departmental dose-optimization program which includes automated exposure control, adjustment of the mA and/or kV according to patient size and/or use of iterative reconstruction technique. COMPARISON:  None Available. FINDINGS: Brain: There is no evidence of acute cortically based infarct, intracranial hemorrhage, mass, midline shift, or extra-axial fluid collection. Confluent hypodensities in the cerebral white matter bilaterally are nonspecific but compatible with severe chronic small vessel ischemic disease given vascular risk factors. Age indeterminate lacunar infarcts are present in the thalami. Mild ventriculomegaly is favored to reflect central predominant cerebral atrophy, and there is somewhat prominent volume loss in the mesial temporal lobes. Vascular: Calcified atherosclerosis at the skull base. No hyperdense vessel. Skull: No fracture or suspicious osseous lesion. Sinuses/Orbits: Visualized paranasal sinuses and mastoid air cells are clear. Unremarkable included orbits. Other: None. IMPRESSION: 1. No evidence of acute cortically based infarct or intracranial hemorrhage. 2. Severe chronic small vessel ischemic disease with age indeterminate lacunar infarcts in the thalami. Electronically Signed   By: Logan Bores M.D.   On: 03/10/2022 12:13   DG Chest Portable 1 View  Result Date: 03/10/2022 CLINICAL DATA:  Shortness of breath. EXAM: PORTABLE CHEST 1 VIEW COMPARISON:  None Available. FINDINGS: The heart size and mediastinal contours are within normal limits. Both lungs are clear. The visualized skeletal structures  are unremarkable. IMPRESSION: No active disease. Electronically Signed   By: Marijo Conception M.D.   On: 03/10/2022 10:46    Microbiology: Results for orders placed or performed in visit on 12/31/17  Microscopic Examination     Status: Abnormal   Collection Time: 12/31/17 11:04 AM   URINE  Result Value Ref Range Status   WBC, UA 0-5 0 - 5 /hpf Final   RBC, UA 0-2 0 - 2 /hpf Final   Epithelial Cells (non renal) 0-10 0 - 10 /hpf Final   Bacteria, UA Moderate (A) None seen/Few Final    Labs: CBC: Recent Labs  Lab 03/06/22 1147 03/10/22 1022  WBC 4.7 4.8  NEUTROABS  --  3.2  HGB 13.1 12.5  HCT 40.9 38.2  MCV 91.9 90.5  PLT 356 [id number]   Basic Metabolic Panel: Recent Labs  Lab 03/06/22 1147 03/10/22 1022 03/11/22 0430  NA 139 134* 138  K 3.4* 3.2* 3.6  CL 103 95* 103  CO2 26 30 28   GLUCOSE 125* 178* 103*  BUN 17 25* 23  CREATININE 0.75 0.99 0.96  CALCIUM 10.3 10.0 10.0  MG  --  2.0  --   PHOS  --  2.9  --    Liver Function Tests: Recent Labs  Lab 03/10/22 1022  AST 21  ALT 15  ALKPHOS 58  BILITOT 0.9  PROT 7.8  ALBUMIN 4.3   CBG: Recent Labs  Lab 03/10/22 1701 03/10/22 2118 03/11/22 0859 03/11/22 1202  GLUCAP 124* 102* 120* 111*  Discharge time spent: greater than 30 minutes.  Signed: Sharen Hones, MD [facility] 03/12/2022

## 2022-03-12 NOTE — Evaluation (Signed)
Speech Language Pathology Evaluation Patient Details Name: Shelly Parks MRN: [id number] DOB: [birth year] Today's Date: 03/12/2022 Time: 0272-5366 SLP Time Calculation (min) (ACUTE ONLY): 28 min  Problem List:  Patient Active Problem List   Diagnosis Date Noted   Acute stroke due to ischemia (HCC) 03/11/2022   Acute metabolic encephalopathy 03/11/2022   Type 2 diabetes mellitus with hyperlipidemia (HCC) 03/11/2022   Acute CVA (cerebrovascular accident) (HCC) 03/11/2022   GERD (gastroesophageal reflux disease) 03/11/2022   Acute encephalopathy 03/10/2022   Hypokalemia 03/10/2022   Memory changes 03/10/2022   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   HLD (hyperlipidemia)    Essential hypertension    Anemia    Major depressive disorder, single episode, unspecified    Reflux    COPD (chronic obstructive pulmonary disease) (HCC)    Incontinence of urine in female    Diabetes mellitus without complication (HCC)    Past Medical History:  Past Medical History:  Diagnosis Date   Anemia    Asthma    COPD (chronic obstructive pulmonary disease) (HCC)    Diabetes mellitus without complication (HCC)    HLD (hyperlipidemia)    HTN (hypertension), benign    Incontinence of urine in female    Major depressive disorder, single episode, unspecified    Reflux    Past Surgical History:  Past Surgical History:  Procedure Laterality Date   BLADDER SURGERY  1969   BREAST BIOPSY Right    long ago pt ? date   BREAST SURGERY  1973   HPI:  Shelly DOUBLE is a 81 y.o. female with medical history significant of anemia, asthma, COPD, type II DM, hyperlipidemia, hypertension, incontinence, major depression, GERD who is brought by her daughter to the emergency department due to worsening confusion that has been getting progressively worse over the last few months.  However, over the past few days she has been acting very strangely. MRI 03/11/22: Patchy small volume acute ischemic nonhemorrhagic left infarct  involving the left temporoccipital region. Age-related cerebral atrophy with advanced chronic microvascular ischemic disease. Daughter present for assessment, reporting that pt's mentation is improved today, but pt has not returned to baseline.   Assessment / Plan / Recommendation Clinical Impression  Pt presents with at least moderate cognitive communication impairment, suspect acute on chronic in nature based on family report for gradual cognitive decline and findings of "patchy small volume acute ischemic nonhemorrhagic left infarct involving the left temporoccipital region." Assessment completed with a combination of dynamic assessment and pt/family interview. Deficits notable for orientation, safety reasoning, immediate/delayed recall, and though organization. Receptive language with mild impairment at yes/no and one step command level. Simple expressive language notable for hesitation, suspect related to thought organization vs anomia. Increased processing time, rephrasing of prompts, and binary options bolstered processing. Repetition utilized to aid recall. Visual cues, demonstration, and tactile prompts bolstered receptive language. Recommend reinforcement of orientation aids, reducing external stimuli, single task completion, pairing verbal with tactile/visual cues, and use of repetition. Education shared with daughter regarding suspected multifocal nature of cognitive profile, external aids to bolster memory and processing, and recommendations for 24/7 supervision for safety. Daughter and pt endorsed understanding. Recommend HH Speech therapy intervention.    SLP Assessment  SLP Recommendation/Assessment: All further Speech Lanaguage Pathology  needs can be addressed in the next venue of care SLP Visit Diagnosis: Cognitive communication deficit (R41.841)    Recommendations for follow up therapy are one component of a multi-disciplinary discharge planning process, led by the attending physician.  Recommendations may be updated based on patient status, additional functional criteria and insurance authorization.    Follow Up Recommendations  Home health SLP    Assistance Recommended at Discharge  Frequent or constant Supervision/Assistance  Functional Status Assessment Patient has had a recent decline in their functional status and demonstrates the ability to make significant improvements in function in a reasonable and predictable amount of time.  Frequency and Duration           SLP Evaluation Cognition  Overall Cognitive Status: Impaired/Different from baseline Arousal/Alertness: Awake/alert Orientation Level: Disoriented to situation;Disoriented to time;Disoriented to place;Disoriented to person (not oriented to last name, year, city, or situation) Attention: Sustained Sustained Attention: Appears intact Memory: Impaired Memory Impairment: Storage deficit;Retrieval deficit (1/3 immediate recall, 0/3 delayed recall) Awareness: Impaired Awareness Impairment: Intellectual impairment Problem Solving: Impaired Problem Solving Impairment: Verbal basic;Functional basic Executive Function: Decision Making;Initiating;Self Monitoring;Organizing Organizing: Impaired Organizing Impairment: Verbal basic Decision Making: Impaired Decision Making Impairment: Verbal basic Initiating: Impaired Initiating Impairment: Verbal basic Self Monitoring: Impaired Self Monitoring Impairment: Verbal basic Safety/Judgment: Impaired (0/4 safety reasoning prompts)       Comprehension  Auditory Comprehension Overall Auditory Comprehension: Impaired Yes/No Questions:  (6/8 yes no situational) Commands: Impaired (required verbal/visual cues for commands during functional task. 3/3 one step commands in isolation) One Step Basic Commands: 75-100% accurate Conversation: Simple Interfering Components: Attention;Processing speed EffectiveTechniques: Extra processing time;Increased  volume;Repetition;Visual/Gestural cues Visual Recognition/Discrimination Discrimination: Not tested Reading Comprehension Reading Status: Not tested    Expression Expression Primary Mode of Expression: Verbal Verbal Expression Overall Verbal Expression: Appears within functional limits for tasks assessed Written Expression Written Expression: Not tested   Oral / Motor  Oral Motor/Sensory Function Overall Oral Motor/Sensory Function: Within functional limits Motor Speech Overall Motor Speech: Appears within functional limits for tasks assessed           [place] Errika Narvaiz Clapp MS CCC-SLP  [place] J Clapp 03/12/2022, 12:21 PM

## 2022-03-12 NOTE — Progress Notes (Addendum)
Neurology Progress Note   S:// Seen and examined. No acute changes overnight.   O:// Current vital signs: BP (!) 115/52 (BP Location: Left Arm)   Pulse 95   Temp 98.3 F (36.8 C)   Resp 20   Ht 5\' 3"  (1.6 m)   Wt 58 kg   SpO2 98%   BMI 22.65 kg/m  Vital signs in last 24 hours: Temp:  [97.8 F (36.6 C)-98.3 F (36.8 C)] 98.3 F (36.8 C) (09/06 0741) Pulse Rate:  [75-100] 95 (09/06 0741) Resp:  [17-20] 20 (09/06 0603) BP: (110-143)/(52-68) 115/52 (09/06 0741) SpO2:  [97 %-100 %] 98 % (09/06 0741) General: She is awake alert, very pleasant, in no distress HEENT: Normocephalic atraumatic Lungs: Clear Cardiovascular: Regular rhythm Abdomen nontender nondistended Extremities warm well perfused with multiple areas of hypopigmentation which are unchanged according to family. Neurological exam Awake alert oriented to self Oriented to the fact that she is not at home but not very clear on where she is. Unable to tell me her age or even her date of birth. She was able to name simple objects with some word substitutions She was able to repeat Attention concentration is poor Cranial nerves II to XII intact Motor examination with no drift in any fours Sensation intact to light touch without extinction Coordination with no dysmetria  No change in exam  Medications  Current Facility-Administered Medications:     stroke: early stages of recovery book, , Does not apply, Once, 01[birth year]28-1973, MD   acetaminophen (TYLENOL) tablet 650 mg, 650 mg, Oral, Q6H PRN **OR** acetaminophen (TYLENOL) suppository 650 mg, 650 mg, Rectal, Q6H PRN, Milon Dikes, MD   albuterol (PROVENTIL) (2.5 MG/3ML) 0.083% nebulizer solution 3 mL, 3 mL, Inhalation, Q6H PRN, Bobette Mo, MD   amLODipine (NORVASC) tablet 10 mg, 10 mg, Oral, Daily, Bobette Mo, MD, 10 mg at 03/11/22 1138   aspirin EC tablet 81 mg, 81 mg, Oral, Daily, Wieting, Richard, MD   atorvastatin (LIPITOR) tablet 40 mg,  40 mg, Oral, QPM, Wieting, Richard, MD, 40 mg at 03/11/22 1829   clopidogrel (PLAVIX) tablet 75 mg, 75 mg, Oral, Daily, Wieting, Richard, MD, 75 mg at 03/11/22 1138   enoxaparin (LOVENOX) injection 40 mg, 40 mg, Subcutaneous, Q24H, 05/11/22, MD, 40 mg at 03/11/22 2240   haloperidol lactate (HALDOL) injection 2.5 mg, 2.5 mg, Intravenous, Q6H PRN, 05/11/22, MD, 2.5 mg at 03/11/22 1556   hydrALAZINE (APRESOLINE) injection 10 mg, 10 mg, Intravenous, Q4H PRN, 05/11/22, MD   mometasone-formoterol [facility]) 100-5 MCG/ACT inhaler 2 puff, 2 puff, Inhalation, BID, [facility], MD, 2 puff at 03/11/22 2242   ondansetron (ZOFRAN) tablet 4 mg, 4 mg, Oral, Q6H PRN **OR** ondansetron (ZOFRAN) injection 4 mg, 4 mg, Intravenous, Q6H PRN, 05/11/22, MD   pantoprazole (PROTONIX) EC tablet 40 mg, 40 mg, Oral, Daily, Bobette Mo, MD, 40 mg at 03/11/22 1138 Labs CBC    Component Value Date/Time   WBC 4.8 03/10/2022 1022   RBC 4.22 03/10/2022 1022   HGB 12.5 03/10/2022 1022   HCT 38.2 03/10/2022 1022   PLT 379 03/10/2022 1022   MCV 90.5 03/10/2022 1022   MCH 29.6 03/10/2022 1022   MCHC 32.7 03/10/2022 1022   RDW 12.2 03/10/2022 1022   LYMPHSABS 1.1 03/10/2022 1022   MONOABS 0.5 03/10/2022 1022   EOSABS 0.0 03/10/2022 1022   BASOSABS 0.0 03/10/2022 1022    CMP  Component Value Date/Time   NA 138 03/11/2022 0430   K 3.6 03/11/2022 0430   CL 103 03/11/2022 0430   CO2 28 03/11/2022 0430   GLUCOSE 103 (H) 03/11/2022 0430   BUN 23 03/11/2022 0430   CREATININE 0.96 03/11/2022 0430   CALCIUM 10.0 03/11/2022 0430   PROT 7.8 03/10/2022 1022   ALBUMIN 4.3 03/10/2022 1022   AST 21 03/10/2022 1022   ALT 15 03/10/2022 1022   ALKPHOS 58 03/10/2022 1022   BILITOT 0.9 03/10/2022 1022   GFRNONAA 59 (L) 03/11/2022 0430   LDL 137 A1c 5.6  Imaging I have reviewed images in epic and the results pertinent to this consultation are: CT-head: No acute  pathology.  Several chronic small vessel ischemic changes with age indeterminate lacunar infarct in bilateral thalami.   MRI examination of the brain: Patchy small volume acute infarct involving the left temporal occipital region.   CTA head and neck - no LVO  2d echo EF >75%, Grade 1 DD Normal LA size Nomal MV AV not well seen IAS not well visualized   ASSESSMENT  81 year old with complaints of altered mental status that has been ongoing for a couple of months with an acute concern for increasing confusion brought in for evaluation and noted to have a left temporal occipital stroke-likely subacute. Stroke risk factor w/u completed.  No LVO or significant stenosis in head or neck vessels to explain the stroke. No Afib on tele. Will need longer term monitoring.  Regarding her mentation and cognitive changes, she might have some underlying cognitive deficits which have been likely unmasked by the acute stroke.  On examination, she has has poor attention concentration as well as some word finding difficulty-in addition to not being able to answer the orientation questions appropriately.  She has no cranial nerve, motor or sensory deficits at this time  Daughter would like to know if she could be in rehab prior to return home due to cognitive deficits that she has which make her care at home difficult without supervision and the daughter who she lives with works a full time day job and may not be able to tend to her.   Impression: Acute ischemic stroke-evaluate for underlying etiology which could be cardioembolic versus atheroembolic - for now etiology remains cryptogenic   Recommendations: A1c at goal <7 Lipid panel revealed an LDL of 137.  High intensity statin atorvastatin 80 mg now and daily for goal LDL of less than 70. Aspirin 81 Plavix 75-for 3 weeks followed by ASA only. No need for permissive HTN due to the fact that we have no known last well and symptoms ongoing for a few  days to weeks. PT OT [facility] 30 day cardiac monitoring to evaluate for underlying Afib Case management and primary team to address placement questions.  Plan relayed to Dr. Chipper Herb -- Milon Dikes, MD Neurologist [facility] Pager: [phone]

## 2022-03-12 NOTE — Evaluation (Signed)
Physical Therapy Evaluation Patient Details Name: Shelly Parks MRN: [id number] DOB: 09/30/1940 Today's Date: 03/12/2022  History of Present Illness  Shelly Parks is a 81 y.o. female with medical history significant of anemia, asthma, COPD, type II DM, hyperlipidemia, hypertension, incontinence, major depression, GERD who is brought by her daughter to the emergency department due to worsening confusion that has been getting progressively worse over the last few months.  Imaging significant for sub-acute infarct to L temporal-occipital area.  Clinical Impression  Patient resting in bed upon arrival to session; daughter present at bedside. Patient alert, but oriented to self only; does follow simple commands, but requires consistent cuing for problem-solving, initiation and sequencing of most functional tasks.  No clinical indicators of pain noted.  Bilat UE/LE strength and ROM grossly symmetrical and WFL; no focal weakness appreciated.  Able to complete bed mobility with mod indep; sit/stand, basic transfers and gait (200') without assist device (intermittent R HHA), cga/close sup.  Demonstrates reciprocal stepping pattern with fair step height/length; mild sway with head turns and dynamic gait components, but self-corrects with LE step strategy.  HHA required for way-finding and path-negotiation. Highly distractible by external environment; constant cuing to redirect attention to task Do recommend consistent supervision due to supervision/safety needs with all activities. Would benefit from skilled PT to address above deficits and promote optimal return to PLOF.; Recommend transition to HHPT upon discharge from acute hospitalization.  If family unable to provide care in home environment, may consider transition to long-term care as needed.         Recommendations for follow up therapy are one component of a multi-disciplinary discharge planning process, led by the attending physician.  Recommendations may  be updated based on patient status, additional functional criteria and insurance authorization.  Follow Up Recommendations Home health PT      Assistance Recommended at Discharge Frequent or constant Supervision/Assistance  Patient can return home with the following  A little help with walking and/or transfers;A little help with bathing/dressing/bathroom    Equipment Recommendations    Recommendations for Other Services       Functional Status Assessment Patient has had a recent decline in their functional status and demonstrates the ability to make significant improvements in function in a reasonable and predictable amount of time.     Precautions / Restrictions Precautions Precautions: Fall Restrictions Weight Bearing Restrictions: No      Mobility  Bed Mobility Overal bed mobility: Modified Independent                  Transfers Overall transfer level: Needs assistance Equipment used: None Transfers: Sit to/from Stand Sit to Stand: Supervision                Ambulation/Gait Ambulation/Gait assistance: Supervision, Min guard Gait Distance (Feet): 200 Feet Assistive device: 1 person hand held assist         General Gait Details: reciprocal stepping pattern with fair step height/length; mild sway with head turns and dynamic gait components, but self-corrects with LE step strategy.  HHA required for way-finding and path-negotiation. Highly distractible by external environment; constant cuing to redirect attention to task  Stairs            Wheelchair Mobility    Modified Rankin (Stroke Patients Only)       Balance Overall balance assessment: Needs assistance Sitting-balance support: No upper extremity supported, Feet supported Sitting balance-Leahy Scale: Good     Standing balance support: No upper extremity supported,  Single extremity supported Standing balance-Leahy Scale: Fair                               Pertinent  Vitals/Pain Pain Assessment Pain Assessment: No/denies pain    Home Living Family/patient expects to be discharged to:: Private residence Living Arrangements: Children Available Help at Discharge: Family Type of Home: House Home Access: Stairs to enter Entrance Stairs-Rails: Can reach both Entrance Stairs-Number of Steps: 4-5   Home Layout: One level   Additional Comments: Daughter works outside of the home (patient alone during the day)    Prior Function Prior Level of Function : Independent/Modified Independent             Mobility Comments: Per daughter at bedside, patient indep without assist device at baseline; denies fall history.       Hand Dominance   Dominant Hand: Right    Extremity/Trunk Assessment   Upper Extremity Assessment Upper Extremity Assessment: Overall WFL for tasks assessed    Lower Extremity Assessment Lower Extremity Assessment: Overall WFL for tasks assessed (grossly 4+/5 throughout; no focal weakness appreciated)       Communication   Communication: No difficulties  Cognition Arousal/Alertness: Awake/alert Behavior During Therapy: WFL for tasks assessed/performed Overall Cognitive Status: Impaired/Different from baseline                                 General Comments: Oriented to self only; pleasant and cooperative, but requiring consistent cuing for problem-solving, initiation and sequencing of all activities        General Comments      Exercises Other Exercises Other Exercises: Toilet transfer, ambulatory without assist device (R HHA for way-finding), cga/close sup.  sit/stand from standard toilet, close sup; standing balance for clothing management, peri-care and hand hygiene, close sup. Shifts weight, demonstrates fair/good functional reach and completes dynamic activities without difficulty. Other Exercises: Min cuing to sequence hand hygiene at sink, and for location of necessary items (soap, paper towel)  during task   Assessment/Plan    PT Assessment Patient needs continued PT services  PT Problem List Decreased activity tolerance;Decreased balance;Decreased mobility;Decreased safety awareness;Decreased cognition       PT Treatment Interventions DME instruction;Gait training;Stair training;Functional mobility training;Therapeutic activities;Balance training;Therapeutic exercise;Neuromuscular re-education;Patient/family education    PT Goals (Current goals can be found in the Care Plan section)  Acute Rehab PT Goals PT Goal Formulation: Patient unable to participate in goal setting Time For Goal Achievement: 03/26/22 Potential to Achieve Goals: Fair    Frequency Min 2X/week     Co-evaluation               AM-PAC PT "6 Clicks" Mobility  Outcome Measure Help needed turning from your back to your side while in a flat bed without using bedrails?: None Help needed moving from lying on your back to sitting on the side of a flat bed without using bedrails?: None Help needed moving to and from a bed to a chair (including a wheelchair)?: A Little Help needed standing up from a chair using your arms (e.g., wheelchair or bedside chair)?: A Little Help needed to walk in hospital room?: A Little Help needed climbing 3-5 steps with a railing? : A Little 6 Click Score: 20    End of Session Equipment Utilized During Treatment: Gait belt Activity Tolerance: Patient tolerated treatment well Patient left: in bed;with  call bell/phone within reach;with bed alarm set;with family/visitor present Nurse Communication: Mobility status PT Visit Diagnosis: Muscle weakness (generalized) (M62.81)    Time: 1010-1028 PT Time Calculation (min) (ACUTE ONLY): 18 min   Charges:   PT Evaluation $PT Eval Moderate Complexity: 1 Mod        Fumiye Lubben H. Manson Passey, PT, DPT, NCS 03/12/22, 12:51 PM [phone]

## 2022-03-12 NOTE — Plan of Care (Signed)

## 2024-01-12 ENCOUNTER — Emergency Department
Admission: EM | Admit: 2024-01-12 | Discharge: 2024-01-12 | Disposition: A | Discharge status: Home / Self Care | Attending: Emergency Medicine | Admitting: Emergency Medicine

## 2024-01-12 DIAGNOSIS — E119 Type 2 diabetes mellitus without complications: Secondary | ICD-10-CM | POA: Insufficient documentation

## 2024-01-12 DIAGNOSIS — E876 Hypokalemia: Secondary | ICD-10-CM | POA: Diagnosis not present

## 2024-01-12 DIAGNOSIS — J449 Chronic obstructive pulmonary disease, unspecified: Secondary | ICD-10-CM | POA: Insufficient documentation

## 2024-01-12 DIAGNOSIS — N179 Acute kidney failure, unspecified: Secondary | ICD-10-CM | POA: Diagnosis not present

## 2024-01-12 DIAGNOSIS — R32 Unspecified urinary incontinence: Secondary | ICD-10-CM | POA: Diagnosis not present

## 2024-01-12 DIAGNOSIS — E871 Hypo-osmolality and hyponatremia: Secondary | ICD-10-CM | POA: Insufficient documentation

## 2024-01-12 DIAGNOSIS — R531 Weakness: Secondary | ICD-10-CM | POA: Diagnosis present

## 2024-01-12 DIAGNOSIS — D649 Anemia, unspecified: Secondary | ICD-10-CM | POA: Insufficient documentation

## 2024-01-12 DIAGNOSIS — I10 Essential (primary) hypertension: Secondary | ICD-10-CM | POA: Insufficient documentation

## 2024-01-12 LAB — COMPREHENSIVE METABOLIC PANEL WITH GFR
ALT: 26 U/L (ref 0–44)
AST: 35 U/L (ref 15–41)
Albumin: 3.4 g/dL — ABNORMAL LOW (ref 3.5–5.0)
Alkaline Phosphatase: 50 U/L (ref 38–126)
Anion gap: 13 (ref 5–15)
BUN: 44 mg/dL — ABNORMAL HIGH (ref 8–23)
CO2: 25 mmol/L (ref 22–32)
Calcium: 9.1 mg/dL (ref 8.9–10.3)
Chloride: 90 mmol/L — ABNORMAL LOW (ref 98–111)
Creatinine, Ser: 1.31 mg/dL — ABNORMAL HIGH (ref 0.44–1.00)
GFR, Estimated: 40 mL/min — ABNORMAL LOW (ref >60)
Glucose, Bld: 200 mg/dL — ABNORMAL HIGH (ref 70–99)
Potassium: 3.3 mmol/L — ABNORMAL LOW (ref 3.5–5.1)
Sodium: 128 mmol/L — ABNORMAL LOW (ref 135–145)
Total Bilirubin: 0.6 mg/dL (ref 0.0–1.2)
Total Protein: 6.8 g/dL (ref 6.5–8.1)

## 2024-01-12 LAB — CBC
HCT: 31.9 % — ABNORMAL LOW (ref 36.0–46.0)
Hemoglobin: 10.7 g/dL — ABNORMAL LOW (ref 12.0–15.0)
MCH: 30 pg (ref 26.0–34.0)
MCHC: 33.5 g/dL (ref 30.0–36.0)
MCV: 89.4 fL (ref 80.0–100.0)
Platelets: 332 K/uL (ref 150–400)
RBC: 3.57 MIL/uL — ABNORMAL LOW (ref 3.87–5.11)
RDW: 12 % (ref 11.5–15.5)
WBC: 4.5 K/uL (ref 4.0–10.5)
nRBC: 0 % (ref 0.0–0.2)

## 2024-01-12 LAB — URINALYSIS, ROUTINE W REFLEX MICROSCOPIC
Bilirubin Urine: NEGATIVE
Glucose, UA: NEGATIVE mg/dL
Ketones, ur: NEGATIVE mg/dL
Leukocytes,Ua: NEGATIVE
Nitrite: NEGATIVE
Protein, ur: NEGATIVE mg/dL
Specific Gravity, Urine: 1.006 (ref 1.005–1.030)
pH: 6 (ref 5.0–8.0)

## 2024-01-12 LAB — MAGNESIUM: Magnesium: 2.1 mg/dL (ref 1.7–2.4)

## 2024-01-12 MED ORDER — POTASSIUM CHLORIDE CRYS ER 20 MEQ PO TBCR
40.0000 meq | EXTENDED_RELEASE_TABLET | Freq: Once | ORAL | Status: AC
  Administered 2024-01-12: 40 meq via ORAL
  Filled 2024-01-12: qty 2

## 2024-01-12 MED ORDER — SODIUM CHLORIDE 0.9 % IV BOLUS
1000.0000 mL | Freq: Once | INTRAVENOUS | Status: AC
  Administered 2024-01-12: 1000 mL via INTRAVENOUS

## 2024-01-12 NOTE — ED Provider Notes (Signed)
 [facility] Provider Note    Event Date/Time   First MD Initiated Contact with Patient 01/12/24 0827     (approximate)   History   Fatigue   HPI  Shelly Parks is a 83 y.o. female who presents to the ED for evaluation of Fatigue   I review a medical DC summary from September 2023.  History of COPD, DM, HTN, HLD, GERD.  Stroke.  Possible dementia.  Patient is brought to the ED by her daughter for 2 days of increasing fatigue and generalized weakness, urinary incontinence is unusual for the patient.  Patient lives at home with her daughter and is quite functional at baseline, ambulating independently, handling her own ADLs.  No falls or syncope reported, no emesis   Physical Exam   Triage Vital Signs: ED Triage Vitals  Encounter Vitals Group     BP 01/12/24 0749 (!) 118/57     Girls Systolic BP Percentile --      Girls Diastolic BP Percentile --      Boys Systolic BP Percentile --      Boys Diastolic BP Percentile --      Pulse Rate 01/12/24 0749 92     Resp 01/12/24 0749 18     Temp 01/12/24 0749 99.2 F (37.3 C)     Temp Source 01/12/24 0749 Oral     SpO2 01/12/24 0749 98 %     Weight 01/12/24 0750 118 lb (53.5 kg)     Height 01/12/24 0750 5' 3 (1.6 m)     Head Circumference --      Peak Flow --      Pain Score 01/12/24 0749 0     Pain Loc --      Pain Education --      Exclude from Growth Chart --     Most recent vital signs: Vitals:   01/12/24 1144 01/12/24 1200  BP:  (!) 160/68  Pulse:  89  Resp:  16  Temp: 98.4 F (36.9 C)   SpO2:  100%    General: Awake, no distress.  Pleasantly disoriented, looks well CV:  Good peripheral perfusion.  Resp:  Normal effort.  Abd:  No distention.  MSK:  No deformity noted.  Neuro:  No focal deficits appreciated. Other:     ED Results / Procedures / Treatments   Labs (all labs ordered are listed, but only abnormal results are displayed) Labs Reviewed  COMPREHENSIVE METABOLIC PANEL  WITH GFR - Abnormal; Notable for the following components:      Result Value   Sodium 128 (*)    Potassium 3.3 (*)    Chloride 90 (*)    Glucose, Bld 200 (*)    BUN 44 (*)    Creatinine, Ser 1.31 (*)    Albumin 3.4 (*)    GFR, Estimated 40 (*)    All other components within normal limits  CBC - Abnormal; Notable for the following components:   RBC 3.57 (*)    Hemoglobin 10.7 (*)    HCT 31.9 (*)    All other components within normal limits  URINALYSIS, ROUTINE W REFLEX MICROSCOPIC - Abnormal; Notable for the following components:   Color, Urine YELLOW (*)    APPearance CLOUDY (*)    Hgb urine dipstick SMALL (*)    Bacteria, UA MANY (*)    All other components within normal limits  MAGNESIUM    EKG Sinus rhythm with a rate of 93 bpm.  Normal axis and intervals without clear signs of acute ischemia.  RADIOLOGY   Official radiology report(s): No results found.  PROCEDURES and INTERVENTIONS:  Procedures  Medications  sodium chloride  0.9 % bolus 1,000 mL (0 mLs Intravenous Stopped 01/12/24 1205)  potassium chloride  SA (KLOR-CON  M) CR tablet 40 mEq (40 mEq Oral Given 01/12/24 0947)     IMPRESSION / MDM / ASSESSMENT AND PLAN / ED COURSE  I reviewed the triage vital signs and the nursing notes.  Differential diagnosis includes, but is not limited to, dehydration, metabolic encephalopathy, electrolyte derangement, UTI, sepsis  {Patient presents with symptoms of an acute illness or injury that is potentially life-threatening.  Pleasant older woman presents from home with generalized weakness and an episode of urinary incontinence.  Mild normocytic anemia without leukocytosis.  Noted to have hyponatremia and hypokalemia with a mild AKI.  Provide IV fluids and potassium replacement.  Awaiting UA.  UA without infectious features.  She is feeling much better after some IV fluids, oral replacement of potassium.  I considered admission for this patient but believe close outpatient  management with daughter and PCP follow-up would be reasonable.  Discussed close return precautions.  Clinical Course as of 01/12/24 1325  Tue Jan 12, 2024  1323 Reassessed, up and ambulatory, sharper and feeling better.  Daughter reports that she certainly seems better.  We discussed reassuring UA without signs of UTI.  We discussed overall metabolic derangements, mild AKI, hyponatremia, hypokalemia.  Discussed plan of care and they are comfortable going home and following up with PCP.  We discussed ED return precautions. [DS]    Clinical Course User Index [DS] Claudene Rover, MD     FINAL CLINICAL IMPRESSION(S) / ED DIAGNOSES   Final diagnoses:  Hyponatremia  Hypokalemia  AKI (acute kidney injury) (HCC)     Rx / DC Orders   ED Discharge Orders     None        Note:  This document was prepared using Dragon voice recognition software and may include unintentional dictation errors.   Claudene Rover, MD 01/12/24 1325

## 2024-01-12 NOTE — ED Notes (Signed)
 Rosina EMT-P at bedside starting line and giving medications.

## 2024-01-12 NOTE — ED Notes (Signed)
 Pt verbalized understanding of discharge instructions. Opportunity for questions provided.

## 2024-01-12 NOTE — ED Triage Notes (Signed)
 Pt presents to the ED via POV from home with daughter. Daughter states that she has noticed a decline in her mobility since Sunday. Pt has a hx of dementia at baseline and daughter states no change in mental status from baseline. Reports that she seemed slow walking to the bathroom on Sunday and yesterday she was even more weak.

## 2024-01-12 NOTE — ED Notes (Signed)
 Pt has been able to ambulate to the toilet and back to the bed w/ 1 staff assist.  Pt normally does use any ambulation aids.

## 2024-01-14 ENCOUNTER — Emergency Department

## 2024-01-14 ENCOUNTER — Inpatient Hospital Stay
Admission: EM | Admit: 2024-01-14 | Discharge: 2024-01-21 | DRG: 689 | Disposition: A | Discharge status: Skilled Nursing Facility | Attending: Hospitalist | Admitting: Hospitalist

## 2024-01-14 ENCOUNTER — Other Ambulatory Visit: Payer: Self-pay

## 2024-01-14 DIAGNOSIS — Z79899 Other long term (current) drug therapy: Secondary | ICD-10-CM

## 2024-01-14 DIAGNOSIS — R410 Disorientation, unspecified: Secondary | ICD-10-CM

## 2024-01-14 DIAGNOSIS — Z8673 Personal history of transient ischemic attack (TIA), and cerebral infarction without residual deficits: Secondary | ICD-10-CM

## 2024-01-14 DIAGNOSIS — I10 Essential (primary) hypertension: Secondary | ICD-10-CM | POA: Diagnosis present

## 2024-01-14 DIAGNOSIS — F0280 Dementia in other diseases classified elsewhere without behavioral disturbance: Secondary | ICD-10-CM | POA: Diagnosis present

## 2024-01-14 DIAGNOSIS — N390 Urinary tract infection, site not specified: Principal | ICD-10-CM | POA: Diagnosis present

## 2024-01-14 DIAGNOSIS — Z1152 Encounter for screening for COVID-19: Secondary | ICD-10-CM

## 2024-01-14 DIAGNOSIS — R32 Unspecified urinary incontinence: Secondary | ICD-10-CM | POA: Diagnosis present

## 2024-01-14 DIAGNOSIS — E559 Vitamin D deficiency, unspecified: Secondary | ICD-10-CM | POA: Diagnosis present

## 2024-01-14 DIAGNOSIS — E639 Nutritional deficiency, unspecified: Secondary | ICD-10-CM | POA: Diagnosis present

## 2024-01-14 DIAGNOSIS — R262 Difficulty in walking, not elsewhere classified: Secondary | ICD-10-CM | POA: Diagnosis present

## 2024-01-14 DIAGNOSIS — N179 Acute kidney failure, unspecified: Secondary | ICD-10-CM | POA: Diagnosis present

## 2024-01-14 DIAGNOSIS — E611 Iron deficiency: Secondary | ICD-10-CM | POA: Diagnosis present

## 2024-01-14 DIAGNOSIS — E785 Hyperlipidemia, unspecified: Secondary | ICD-10-CM | POA: Diagnosis present

## 2024-01-14 DIAGNOSIS — G9341 Metabolic encephalopathy: Secondary | ICD-10-CM | POA: Diagnosis present

## 2024-01-14 DIAGNOSIS — N3289 Other specified disorders of bladder: Secondary | ICD-10-CM | POA: Diagnosis present

## 2024-01-14 DIAGNOSIS — Z8249 Family history of ischemic heart disease and other diseases of the circulatory system: Secondary | ICD-10-CM

## 2024-01-14 DIAGNOSIS — R531 Weakness: Secondary | ICD-10-CM | POA: Diagnosis not present

## 2024-01-14 DIAGNOSIS — E860 Dehydration: Secondary | ICD-10-CM | POA: Diagnosis present

## 2024-01-14 DIAGNOSIS — T502X5A Adverse effect of carbonic-anhydrase inhibitors, benzothiadiazides and other diuretics, initial encounter: Secondary | ICD-10-CM | POA: Diagnosis present

## 2024-01-14 DIAGNOSIS — J4489 Other specified chronic obstructive pulmonary disease: Secondary | ICD-10-CM | POA: Diagnosis present

## 2024-01-14 DIAGNOSIS — E1165 Type 2 diabetes mellitus with hyperglycemia: Secondary | ICD-10-CM | POA: Diagnosis present

## 2024-01-14 DIAGNOSIS — R2689 Other abnormalities of gait and mobility: Secondary | ICD-10-CM | POA: Diagnosis present

## 2024-01-14 DIAGNOSIS — R310 Gross hematuria: Secondary | ICD-10-CM | POA: Diagnosis present

## 2024-01-14 DIAGNOSIS — Z87891 Personal history of nicotine dependence: Secondary | ICD-10-CM

## 2024-01-14 DIAGNOSIS — Z8052 Family history of malignant neoplasm of bladder: Secondary | ICD-10-CM

## 2024-01-14 DIAGNOSIS — Z7982 Long term (current) use of aspirin: Secondary | ICD-10-CM

## 2024-01-14 DIAGNOSIS — Z515 Encounter for palliative care: Secondary | ICD-10-CM

## 2024-01-14 DIAGNOSIS — E876 Hypokalemia: Secondary | ICD-10-CM | POA: Diagnosis present

## 2024-01-14 LAB — URINALYSIS, W/ REFLEX TO CULTURE (INFECTION SUSPECTED)
Bilirubin Urine: NEGATIVE
Glucose, UA: NEGATIVE mg/dL
Ketones, ur: NEGATIVE mg/dL
Nitrite: NEGATIVE
Protein, ur: 100 mg/dL — AB
Specific Gravity, Urine: 1.012 (ref 1.005–1.030)
WBC, UA: >50 WBC/hpf (ref 0–5)
pH: 8 (ref 5.0–8.0)

## 2024-01-14 LAB — CBC WITH DIFFERENTIAL/PLATELET
Abs Immature Granulocytes: 0.02 K/uL (ref 0.00–0.07)
Basophils Absolute: 0 K/uL (ref 0.0–0.1)
Basophils Relative: 0 %
Eosinophils Absolute: 0 K/uL (ref 0.0–0.5)
Eosinophils Relative: 0 %
HCT: 33.1 % — ABNORMAL LOW (ref 36.0–46.0)
Hemoglobin: 10.8 g/dL — ABNORMAL LOW (ref 12.0–15.0)
Immature Granulocytes: 0 %
Lymphocytes Relative: 20 %
Lymphs Abs: 1.3 K/uL (ref 0.7–4.0)
MCH: 29.7 pg (ref 26.0–34.0)
MCHC: 32.6 g/dL (ref 30.0–36.0)
MCV: 90.9 fL (ref 80.0–100.0)
Monocytes Absolute: 0.5 K/uL (ref 0.1–1.0)
Monocytes Relative: 8 %
Neutro Abs: 4.8 K/uL (ref 1.7–7.7)
Neutrophils Relative %: 72 %
Platelets: 359 K/uL (ref 150–400)
RBC: 3.64 MIL/uL — ABNORMAL LOW (ref 3.87–5.11)
RDW: 12.4 % (ref 11.5–15.5)
WBC: 6.7 K/uL (ref 4.0–10.5)
nRBC: 0 % (ref 0.0–0.2)

## 2024-01-14 LAB — BASIC METABOLIC PANEL WITH GFR
Anion gap: 13 (ref 5–15)
BUN: 31 mg/dL — ABNORMAL HIGH (ref 8–23)
CO2: 24 mmol/L (ref 22–32)
Calcium: 9.7 mg/dL (ref 8.9–10.3)
Chloride: 93 mmol/L — ABNORMAL LOW (ref 98–111)
Creatinine, Ser: 0.99 mg/dL (ref 0.44–1.00)
GFR, Estimated: 57 mL/min — ABNORMAL LOW (ref >60)
Glucose, Bld: 306 mg/dL — ABNORMAL HIGH (ref 70–99)
Potassium: 3.3 mmol/L — ABNORMAL LOW (ref 3.5–5.1)
Sodium: 130 mmol/L — ABNORMAL LOW (ref 135–145)

## 2024-01-14 LAB — PHOSPHORUS: Phosphorus: 2.4 mg/dL — ABNORMAL LOW (ref 2.5–4.6)

## 2024-01-14 LAB — IRON AND TIBC
Iron: 20 ug/dL — ABNORMAL LOW (ref 28–170)
Saturation Ratios: 8 % — ABNORMAL LOW (ref 10.4–31.8)
TIBC: 256 ug/dL (ref 250–450)
UIBC: 236 ug/dL

## 2024-01-14 LAB — HEMOGLOBIN A1C
Hgb A1c MFr Bld: 6.2 % — ABNORMAL HIGH (ref 4.8–5.6)
Mean Plasma Glucose: 131 mg/dL

## 2024-01-14 LAB — CK: Total CK: 239 U/L — ABNORMAL HIGH (ref 38–234)

## 2024-01-14 LAB — OSMOLALITY: Osmolality: 297 mosm/kg — ABNORMAL HIGH (ref 275–295)

## 2024-01-14 LAB — LIPID PANEL
Cholesterol: 167 mg/dL (ref 0–200)
HDL: 44 mg/dL (ref >40)
LDL Cholesterol: 107 mg/dL — ABNORMAL HIGH (ref 0–99)
Total CHOL/HDL Ratio: 3.8 ratio
Triglycerides: 82 mg/dL (ref <150)
VLDL: 16 mg/dL (ref 0–40)

## 2024-01-14 LAB — MAGNESIUM: Magnesium: 2.1 mg/dL (ref 1.7–2.4)

## 2024-01-14 LAB — CBG MONITORING, ED
Glucose-Capillary: 160 mg/dL — ABNORMAL HIGH (ref 70–99)
Glucose-Capillary: 245 mg/dL — ABNORMAL HIGH (ref 70–99)

## 2024-01-14 LAB — FOLATE: Folate: 18.8 ng/mL (ref >5.9)

## 2024-01-14 MED ORDER — SODIUM CHLORIDE 0.9 % IV SOLN: 250.0000 mL | INTRAVENOUS | Status: AC | PRN

## 2024-01-14 MED ORDER — ONDANSETRON HCL 4 MG PO TABS: 4.0000 mg | ORAL_TABLET | Freq: Four times a day (QID) | ORAL | Status: DC | PRN

## 2024-01-14 MED ORDER — SODIUM CHLORIDE 0.9% FLUSH
3.0000 mL | Freq: Two times a day (BID) | INTRAVENOUS | Status: DC
  Administered 2024-01-14 – 2024-01-21 (×12): 3 mL via INTRAVENOUS

## 2024-01-14 MED ORDER — AMLODIPINE BESYLATE 10 MG PO TABS
10.0000 mg | ORAL_TABLET | Freq: Every day | ORAL | Status: DC
  Administered 2024-01-15 – 2024-01-16 (×2): 10 mg via ORAL
  Filled 2024-01-14: qty 2
  Filled 2024-01-14 (×2): qty 1

## 2024-01-14 MED ORDER — PANTOPRAZOLE SODIUM 40 MG PO TBEC
40.0000 mg | DELAYED_RELEASE_TABLET | Freq: Every day | ORAL | Status: DC
  Administered 2024-01-15 – 2024-01-21 (×7): 40 mg via ORAL
  Filled 2024-01-14 (×7): qty 1

## 2024-01-14 MED ORDER — SODIUM CHLORIDE 0.9% FLUSH
3.0000 mL | Freq: Two times a day (BID) | INTRAVENOUS | Status: DC
  Administered 2024-01-14 – 2024-01-21 (×14): 3 mL via INTRAVENOUS

## 2024-01-14 MED ORDER — ASPIRIN 81 MG PO TBEC
81.0000 mg | DELAYED_RELEASE_TABLET | Freq: Every day | ORAL | Status: DC
  Administered 2024-01-15 – 2024-01-18 (×4): 81 mg via ORAL
  Filled 2024-01-14 (×4): qty 1

## 2024-01-14 MED ORDER — ONDANSETRON HCL 4 MG/2ML IJ SOLN: 4.0000 mg | Freq: Four times a day (QID) | INTRAMUSCULAR | Status: DC | PRN

## 2024-01-14 MED ORDER — POTASSIUM CHLORIDE 20 MEQ PO PACK
40.0000 meq | PACK | Freq: Once | ORAL | Status: AC
  Administered 2024-01-14: 40 meq via ORAL
  Filled 2024-01-14: qty 2

## 2024-01-14 MED ORDER — SODIUM CHLORIDE 0.9% FLUSH: 3.0000 mL | INTRAVENOUS | Status: DC | PRN

## 2024-01-14 MED ORDER — ACETAMINOPHEN 325 MG PO TABS
650.0000 mg | ORAL_TABLET | Freq: Four times a day (QID) | ORAL | Status: DC | PRN
Start: 2024-01-14 — End: 2024-01-21
  Administered 2024-01-16 (×2): 650 mg via ORAL
  Filled 2024-01-14 (×2): qty 2

## 2024-01-14 MED ORDER — ACETAMINOPHEN 650 MG RE SUPP: 650.0000 mg | Freq: Four times a day (QID) | RECTAL | Status: DC | PRN

## 2024-01-14 MED ORDER — HYDRALAZINE HCL 20 MG/ML IJ SOLN: 10.0000 mg | Freq: Four times a day (QID) | INTRAMUSCULAR | Status: DC | PRN

## 2024-01-14 MED ORDER — INSULIN ASPART 100 UNIT/ML IJ SOLN
0.0000 [IU] | Freq: Three times a day (TID) | INTRAMUSCULAR | Status: DC
  Administered 2024-01-14: 2 [IU] via SUBCUTANEOUS
  Administered 2024-01-15: 1 [IU] via SUBCUTANEOUS
  Administered 2024-01-15 – 2024-01-16 (×5): 2 [IU] via SUBCUTANEOUS
  Administered 2024-01-17 (×2): 1 [IU] via SUBCUTANEOUS
  Administered 2024-01-17 – 2024-01-18 (×3): 2 [IU] via SUBCUTANEOUS
  Filled 2024-01-14 (×12): qty 1

## 2024-01-14 MED ORDER — VENLAFAXINE HCL ER 37.5 MG PO CP24: 37.5000 mg | ORAL_CAPSULE | Freq: Every day | ORAL | Status: DC

## 2024-01-14 MED ORDER — FLUTICASONE FUROATE-VILANTEROL 100-25 MCG/ACT IN AEPB
1.0000 | INHALATION_SPRAY | Freq: Every day | RESPIRATORY_TRACT | Status: DC
  Administered 2024-01-14 – 2024-01-16 (×3): 1 via RESPIRATORY_TRACT
  Filled 2024-01-14: qty 28

## 2024-01-14 MED ORDER — ENOXAPARIN SODIUM 40 MG/0.4ML IJ SOSY
40.0000 mg | PREFILLED_SYRINGE | INTRAMUSCULAR | Status: DC
Start: 2024-01-14 — End: 2024-01-16
  Administered 2024-01-14 – 2024-01-15 (×2): 40 mg via SUBCUTANEOUS
  Filled 2024-01-14 (×2): qty 0.4

## 2024-01-14 MED ORDER — ALBUTEROL SULFATE (2.5 MG/3ML) 0.083% IN NEBU: 2.5000 mg | INHALATION_SOLUTION | Freq: Four times a day (QID) | RESPIRATORY_TRACT | Status: DC | PRN

## 2024-01-14 MED ORDER — SODIUM CHLORIDE 0.9 % IV SOLN: INTRAVENOUS | Status: DC

## 2024-01-14 MED ORDER — SODIUM CHLORIDE 0.9 % IV BOLUS
1000.0000 mL | Freq: Once | INTRAVENOUS | Status: AC
  Administered 2024-01-14: 1000 mL via INTRAVENOUS

## 2024-01-14 NOTE — ED Provider Notes (Signed)
 [facility] Provider Note    Event Date/Time   First MD Initiated Contact with Patient 01/14/24 1141     (approximate)   History   Weakness   HPI  Shelly Parks is a 83 year old female presenting to the ER for evaluation of weakness.  Accompanied by family provides collateral history.  They note that on Tuesday, patient had onset of weakness and decreased interaction.  She was seen in the ER where she had an overall reassuring workup.  She was having some mild gait instability at that time, but now has had progressive weakness to where she is not able to ambulate on her own which is significantly different than her baseline.  No falls.  In addition, family has noticed malodorous urine.  I reviewed patient's discharge summary from 03/12/2022.  At that time patient presented with altered mental status and was found to have an acute stroke with associated metabolic encephalopathy with possible dementia.     Physical Exam   Triage Vital Signs: ED Triage Vitals  Encounter Vitals Group     BP 01/14/24 1008 116/61     Girls Systolic BP Percentile --      Girls Diastolic BP Percentile --      Boys Systolic BP Percentile --      Boys Diastolic BP Percentile --      Pulse Rate 01/14/24 1008 98     Resp 01/14/24 1008 16     Temp 01/14/24 1008 98.6 F (37 C)     Temp Source 01/14/24 1008 Oral     SpO2 01/14/24 1008 100 %     Weight 01/14/24 1015 118 lb (53.5 kg)     Height 01/14/24 1015 5' 3 (1.6 m)     Head Circumference --      Peak Flow --      Pain Score 01/14/24 1013 0     Pain Loc --      Pain Education --      Exclude from Growth Chart --     Most recent vital signs: Vitals:   01/14/24 1400 01/14/24 1430  BP: (!) 126/57 139/66  Pulse: 81 84  Resp:  18  Temp:  98.4 F (36.9 C)  SpO2: 99% 97%     General: Awake, interactive  CV:  Regular rate, good peripheral perfusion.  Resp:  Unlabored respirations, lungs clear to  auscultation Abd:  Nondistended, soft, nontender Neuro:  Symmetric facial movement, fluid speech, 5-5 strength of the bilateral upper extremities, mild generalized weakness of the bilateral lower extremities   ED Results / Procedures / Treatments   Labs (all labs ordered are listed, but only abnormal results are displayed) Labs Reviewed  CBC WITH DIFFERENTIAL/PLATELET - Abnormal; Notable for the following components:      Result Value   RBC 3.64 (*)    Hemoglobin 10.8 (*)    HCT 33.1 (*)    All other components within normal limits  BASIC METABOLIC PANEL WITH GFR - Abnormal; Notable for the following components:   Sodium 130 (*)    Potassium 3.3 (*)    Chloride 93 (*)    Glucose, Bld 306 (*)    BUN 31 (*)    GFR, Estimated 57 (*)    All other components within normal limits  MAGNESIUM  URINALYSIS, W/ REFLEX TO CULTURE (INFECTION SUSPECTED)  IRON AND TIBC  FOLATE  VITAMIN D 25 HYDROXY (VIT D DEFICIENCY, FRACTURES)  PHOSPHORUS  OSMOLALITY  CK  EKG EKG independently reviewed and interpreted by myself demonstrates:  EKG demonstrates sinus rhythm at a rate of 95, PR 148, QRS 90, QTc 424, no acute ST changes  RADIOLOGY Imaging independently reviewed and interpreted by myself demonstrates:  CT head without acute bleed   Formal Radiology Read:  CT Head Wo Contrast Result Date: 01/14/2024 CLINICAL DATA:  Mental status change, unknown cause EXAM: CT HEAD WITHOUT CONTRAST TECHNIQUE: Contiguous axial images were obtained from the base of the skull through the vertex without intravenous contrast. RADIATION DOSE REDUCTION: This exam was performed according to the departmental dose-optimization program which includes automated exposure control, adjustment of the mA and/or kV according to patient size and/or use of iterative reconstruction technique. COMPARISON:  March 11, 2022 FINDINGS: Brain: Proportional prominence of the ventricles and sulci, consistent with diffuse cerebral  parenchymal volume loss. The ventricles otherwise maintained midline position without midline shift. Gray-white differentiation is preserved.Confluent periventricular and subcortical white matter hypoattenuation, most consistent with changes of severe chronic ischemic microvascular disease.No evidence of acute territorial infarction, extra-axial fluid collection, hemorrhage, or mass lesion. The basilar cisterns are patent without downward herniation. The cerebellar hemispheres and vermis are well formed without mass lesion or focal attenuation abnormality. Vascular: No hyperdense vessel. Calcified atherosclerotic plaque within the cavernous/supraclinoid internal carotid arteries. Skull: Normal. Negative for fracture or focal lesion. Sinuses/Orbits: The paranasal sinuses and mastoids are clear.The globes appear intact. No retrobulbar hematoma. Other: None. IMPRESSION: 1. No acute intracranial abnormality, specifically, no acute hemorrhage, territorial infarction, or intracranial mass. 2. Global cerebral volume loss with sequelae of advanced chronic ischemic microvascular disease. Electronically Signed   By: Rogelia Myers M.D.   On: 01/14/2024 13:01    PROCEDURES:  Critical Care performed: No  Procedures   MEDICATIONS ORDERED IN ED: Medications  sodium chloride  0.9 % bolus 1,000 mL (1,000 mLs Intravenous New Bag/Given 01/14/24 1455)     IMPRESSION / MDM / ASSESSMENT AND PLAN / ED COURSE  I reviewed the triage vital signs and the nursing notes.  Differential diagnosis includes, but is not limited to, anemia, electrolyte abnormality, UTI, CVA considered, though no focal deficits on exam  Patient's presentation is most consistent with acute presentation with potential threat to life or bodily function.  83 year old female presenting to the emergency department for evaluation of generalized weakness and impaired ambulation.  Recent labs with natremia mild hypokalemia, actually slightly improved on  labs today.  Concerns for urinary symptoms, will repeat urinalysis.  Will also obtain head CT given reported change in mental status.  CT head without acute bleed.  Patient reassessed.  Remains feeling weak, unable to ambulate.  Family concerned about her significant acute decline in functional status.  While she does not have focal deficits on exam, has previously presented with a stroke with confusion alone.  She is outside the window for any sort of intervention, but do think admission for further evaluation of her acute weakness is reasonable.  Will reach out to hospitalist team.  Clinical Course as of 01/14/24 1533  Thu Jan 14, 2024  1528 Case discussed with Dr. Von.  He will evaluate the patient for anticipated admission. [NR]    Clinical Course User Index [NR] Levander Slate, MD     FINAL CLINICAL IMPRESSION(S) / ED DIAGNOSES   Final diagnoses:  Acute weakness  Acute confusion     Rx / DC Orders   ED Discharge Orders     None        Note:  This document  was prepared using Conservation officer, historic buildings and may include unintentional dictation errors.   Levander Slate, MD 01/14/24 [phone]

## 2024-01-14 NOTE — ED Notes (Signed)
 Placed fall bundle

## 2024-01-14 NOTE — H&P (Signed)
 [facility] History and Physical   Patient: Shelly Parks FMW:[id number]   PCP: Gerome Tillman CROME, FNP DOB: Jun 03, 1941   DOA: 01/14/2024   DOS: 01/14/2024   DOS: the patient was seen and examined on 01/14/2024  Patient coming from: The patient is coming from Home  Chief Complaint: Generalized weakness  HPI: Shelly Parks is a 83 y.o. female with Past medical history of HTN, HLD, prediabetic, COPD, depression, dementia as reviewed from EMR, patient is very poor historian, AAO x 1.  EMR reviewed, got information from patient's daughter at bedside and from ED attending.  As per patient's daughter she is feeling generalized weakness for past few days to the point that she is unable to ambulate.  No new specific complaints. On further questioning she did mention polyuria and polydipsia, ED workup showed hyperglycemia.  My suspicion is that she may be having all the symptoms due to dehydration and hyperglycemia.  ED Course: VS afebrile, HR 98, RR 16, BP 116/61, 100% on room air BMP: Na 130, K3.3, blood glucose 306 elevated BUN 31 CBC: Hb 10.8, rest within normal range CT head: Negative for any acute stroke.  Chronic ischemic microvascular disease.  [facility] was consulted for admission and further management as below.  Review of Systems: as mentioned in the history of present illness.  All other systems reviewed and are negative.  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 date   BREAST SURGERY  1973   Social History:  reports that she quit smoking about 53 years ago. She has never used smokeless tobacco.  She reports that she does not drink alcohol and does not use drugs.  No Known Allergies   Family history reviewed and not pertinent Family History  Problem Relation Age of Onset   Cancer Father    Hypertension Mother    Cancer Brother    Hypertension Sister    Bladder Cancer Sister      Prior to Admission medications   Medication Sig Start Date End Date Taking? Authorizing Provider  amLODipine  (NORVASC ) 10 MG tablet Take 10 mg by mouth daily.   Yes [provider]  aspirin  EC 81 MG tablet Take 1 tablet (81 mg total) by mouth daily. Swallow whole. 03/13/22  Yes Laurita Pillion, MD  fluticasone -salmeterol (WIXELA INHUB) 100-50 MCG/ACT AEPB Inhale 1 puff into the lungs.  Inhale 1 inhalation into the lungs every 12 (twelve) hours 04/02/22  Yes [provider]  hydrochlorothiazide  (HYDRODIURIL ) 25 MG tablet Take 25 mg by mouth every morning. 12/15/23  Yes [provider]  lisinopril  (PRINIVIL ,ZESTRIL ) 40 MG tablet Take 40 mg by mouth daily.   Yes [provider]  albuterol  (PROVENTIL  HFA;VENTOLIN  HFA) 108 (90 BASE) MCG/ACT inhaler Inhale into the lungs every 6 (six) hours as needed for wheezing or shortness of breath. Patient not taking: Reported on 01/14/2024    [provider]  Calcium -Magnesium-Vitamin D (CALCIUM  500 PO) Take 1 tablet by mouth. Patient not taking: Reported on 01/14/2024    [provider]  Iron-Vitamin C (VITRON-C PO) Take 1 tablet by mouth. Patient not taking: Reported on 01/14/2024    [provider]  mirabegron  ER (MYRBETRIQ ) 50 MG TB24 tablet Take 1 tablet (50 mg total) by mouth daily. Patient not taking: Reported on 01/14/2024 12/31/17   Nieves Cough, MD  pantoprazole  (PROTONIX ) 40 MG tablet Take 1 tablet (40 mg total) by mouth daily. Patient not taking: Reported on 01/14/2024 03/13/22   Laurita Pillion, MD  venlafaxine  XR (EFFEXOR -XR) 37.5 MG 24 hr capsule Take 37.5 mg by mouth daily with breakfast. Patient not  taking: Reported on 01/14/2024    [provider]    Physical Exam: Vitals:   01/14/24 1008 01/14/24 1015 01/14/24 1400 01/14/24 1430  BP: 116/61  (!) 126/57 139/66  Pulse: 98  81 84  Resp: 16   18  Temp: 98.6 F (37 C)   98.4 F (36.9 C)  TempSrc: Oral   Oral  SpO2: 100%  99% 97%  Weight:  53.5 kg    Height:  5' 3 (1.6 m)      General: alert and AAOx 1. Appear in no acute distress, affect appropriate Eyes: PERRLA, Conjunctiva normal ENT: Oral Mucosa Clear, moist  Neck: no JVD, no Abnormal Mass Or lumps Cardiovascular: S1 and S2 Present, no Murmur, peripheral pulses symmetrical Respiratory: good respiratory effort, Bilateral Air entry equal and Decreased, no signs of accessory muscle use, Clear to Auscultation, no Crackles, no wheezes Abdomen: Bowel Sound present, Soft and no tenderness, no hernia Skin: no rashes  Extremities: no Pedal edema, no calf tenderness Neurologic: without any new focal findings Gait not checked due to patient safety concerns  Data Reviewed: I have personally reviewed and interpreted labs, imaging as discussed below.  CBC: Recent Labs  Lab 01/12/24 0754 01/14/24 1017  WBC 4.5 6.7  NEUTROABS  --  4.8  HGB 10.7* 10.8*  HCT 31.9* 33.1*  MCV 89.4 90.9  PLT 332 359   Basic Metabolic Panel: Recent Labs  Lab 01/12/24 0754 01/14/24 1017  NA 128* 130*  K 3.3* 3.3*  CL 90* 93*  CO2 25 24  GLUCOSE 200* 306*  BUN 44* 31*  CREATININE 1.31* 0.99  CALCIUM  9.1 9.7  MG 2.1 2.1   GFR: Estimated Creatinine Clearance: 35.6 mL/min (by C-G formula based on SCr of 0.99 mg/dL). Liver Function Tests: Recent Labs  Lab 01/12/24 0754  AST 35  ALT 26  ALKPHOS 50  BILITOT 0.6  PROT 6.8  ALBUMIN 3.4*   No results for input(s): LIPASE, AMYLASE in the last 168 hours. No results for input(s): AMMONIA in the last 168 hours. Coagulation Profile: No results for input(s): INR, PROTIME in the last 168 hours. Cardiac Enzymes: No  results for input(s): CKTOTAL, CKMB, CKMBINDEX, TROPONINI in the last 168 hours. BNP (last 3 results) No results for input(s): PROBNP in the last 8760 hours. HbA1C: No results for input(s): HGBA1C in the last 72 hours. CBG: No results for input(s): GLUCAP in the last 168 hours. Lipid Profile: No results for input(s): CHOL, HDL, LDLCALC, TRIG, CHOLHDL, LDLDIRECT in the last 72 hours. Thyroid Function Tests: No results for input(s): TSH, T4TOTAL, FREET4, T3FREE, THYROIDAB in the last 72 hours. Anemia Panel: No results for input(s): VITAMINB12, FOLATE, FERRITIN, TIBC, IRON, RETICCTPCT in the last 72 hours. Urine analysis:    Component Value Date/Time   COLORURINE YELLOW (A) 01/12/2024 1218   APPEARANCEUR CLOUDY (A) 01/12/2024 1218   APPEARANCEUR Clear 12/31/2017 1104   LABSPEC 1.006 01/12/2024  1218   PHURINE 6.0 01/12/2024 1218   GLUCOSEU NEGATIVE 01/12/2024 1218   HGBUR SMALL (A) 01/12/2024 1218   BILIRUBINUR NEGATIVE 01/12/2024 1218   BILIRUBINUR Negative 12/31/2017 1104   KETONESUR NEGATIVE 01/12/2024 1218   PROTEINUR NEGATIVE 01/12/2024 1218   NITRITE NEGATIVE 01/12/2024 1218   LEUKOCYTESUR NEGATIVE 01/12/2024 1218    Radiological Exams on Admission: CT Head Wo Contrast Result Date: 01/14/2024 CLINICAL DATA:  Mental status change, unknown cause EXAM: CT HEAD WITHOUT CONTRAST TECHNIQUE: Contiguous axial images were obtained from the base of the skull through the vertex without intravenous contrast. RADIATION DOSE REDUCTION: This exam was performed according to the departmental dose-optimization program which includes automated exposure control, adjustment of the mA and/or kV according to patient size and/or use of iterative reconstruction technique. COMPARISON:  March 11, 2022 FINDINGS: Brain: Proportional prominence of the ventricles and sulci, consistent with diffuse cerebral parenchymal volume loss. The ventricles otherwise  maintained midline position without midline shift. Gray-white differentiation is preserved.Confluent periventricular and subcortical white matter hypoattenuation, most consistent with changes of severe chronic ischemic microvascular disease.No evidence of acute territorial infarction, extra-axial fluid collection, hemorrhage, or mass lesion. The basilar cisterns are patent without downward herniation. The cerebellar hemispheres and vermis are well formed without mass lesion or focal attenuation abnormality. Vascular: No hyperdense vessel. Calcified atherosclerotic plaque within the cavernous/supraclinoid internal carotid arteries. Skull: Normal. Negative for fracture or focal lesion. Sinuses/Orbits: The paranasal sinuses and mastoids are clear.The globes appear intact. No retrobulbar hematoma. Other: None. IMPRESSION: 1. No acute intracranial abnormality, specifically, no acute hemorrhage, territorial infarction, or intracranial mass. 2. Global cerebral volume loss with sequelae of advanced chronic ischemic microvascular disease. Electronically Signed   By: Rogelia Myers M.D.   On: 01/14/2024 13:01   EKG: Independently reviewed. normal EKG, normal sinus rhythm. I reviewed all nursing notes, pharmacy notes, vitals, pertinent old records.  Assessment/Plan Principal Problem:   Weakness generalized   # Hyperglycemia due to diabetes mellitus, new onset History of prediabetes Presented with generalized weakness, polyuria and polydipsia. Serum glucose 306 Started IV fluid for hydration NovoLog  sliding scale Monitor CBG, continue diabetic diet Check hemoglobin A1c   # Hyponatremia most likely due to hyperglycemia Continue IV fluid Check BMP daily  # Hypokalemia, potassium repleted. Magnesium within normal range Check phosphorus level  # HTN, HLD, history of prior CVA No residual weakness Resumed aspirin  Held home meds amlodipine , hydrochlorothiazide  and lisinopril  due to dehydration and  electrolyte imbalance Resume home medications when patient is stable Use IV hydralazine  as needed Monitor BP and titrate medications accordingly  # COPD, no exacerbation noticed on exam Home inhaler switch to Breo Ellipta  inhaler Continue albuterol  as needed   # Generalized weakness and difficulty ambulation due to above Continue fall precautions, turn patient every 2 hourly Ambulate with assistance Follow PT and OT eval Follow TOC for possible home health versus SNF placement    Nutrition: Carb modified diet DVT Prophylaxis: Subcutaneous Lovenox   Advance goals of care discussion: Full code   Consults: None  Family Communication: family was present at bedside, at the time of interview.  Opportunity was given to ask question and all questions were answered satisfactorily.  Disposition: Admitted as observation, no med-surge unit. Likely to be discharged Home with HH, in 1-2 days.  I have discussed plan of care as described above with RN and patient/family.  Severity of Illness: The appropriate patient status for this patient is OBSERVATION. Observation status is judged to be reasonable and necessary in order  to provide the required intensity of service to ensure the patient's safety. The patient's presenting symptoms, physical exam findings, and initial radiographic and laboratory data in the context of their medical condition is felt to place them at decreased risk for further clinical deterioration. Furthermore, it is anticipated that the patient will be medically stable for discharge from the hospital within 2 midnights of admission.    Author: ELVAN SOR, MD [facility] 01/14/2024 4:09 PM   To reach On-call, see care teams to locate the attending and reach out to them via [email or web address]. If 7PM-7AM, please contact night-coverage If you still have difficulty reaching the attending provider, please page the [facility] (Director on Call) for [facility] on amion for  assistance.

## 2024-01-14 NOTE — Inpatient Diabetes Management (Signed)
 Inpatient Diabetes Program Recommendations  AACE/ADA: New Consensus Statement on Inpatient Glycemic Control   Target Ranges:  Prepandial:   less than 140 mg/dL      Peak postprandial:   less than 180 mg/dL (1-2 hours)      Critically ill patients:  140 - 180 mg/dL    Latest Reference Range & Units 01/12/24 07:54 01/14/24 10:17  Glucose 70 - 99 mg/dL 799 (H) 693 (H)    Latest Reference Range & Units 03/10/22 10:22  Hemoglobin A1C 4.8 - 5.6 % 5.6   Review of Glycemic Control  Diabetes history: DM2 Outpatient Diabetes medications: None Current orders for Inpatient glycemic control: None  Inpatient Diabetes Program Recommendations:    Insulin : If admitted, please consider ordering CBGs AC&HS and Novolog  0-9 units AC&HS.  HbgA1C: Please consider ordering an A1C to evaluate glycemic control over the past 2-3 months.  Thanks, Earnie Gainer, RN, MSN, CDCES Diabetes Coordinator Inpatient Diabetes Program [phone] (Team Pager from 8am to 5pm)

## 2024-01-14 NOTE — ED Triage Notes (Signed)
 Pt to ED with daughter. Pt's daughter states pt has had increasing weakness and lower energy since Tuesday. Pt not able to walk since Tuesday per daughter. Pt has hx dementia. Pt oriented to self.

## 2024-01-15 DIAGNOSIS — R531 Weakness: Secondary | ICD-10-CM | POA: Diagnosis not present

## 2024-01-15 LAB — BASIC METABOLIC PANEL WITH GFR
Anion gap: 11 (ref 5–15)
BUN: 34 mg/dL — ABNORMAL HIGH (ref 8–23)
CO2: 22 mmol/L (ref 22–32)
Calcium: 9.2 mg/dL (ref 8.9–10.3)
Chloride: 98 mmol/L (ref 98–111)
Creatinine, Ser: 1.05 mg/dL — ABNORMAL HIGH (ref 0.44–1.00)
GFR, Estimated: 53 mL/min — ABNORMAL LOW (ref >60)
Glucose, Bld: 209 mg/dL — ABNORMAL HIGH (ref 70–99)
Potassium: 3.7 mmol/L (ref 3.5–5.1)
Sodium: 131 mmol/L — ABNORMAL LOW (ref 135–145)

## 2024-01-15 LAB — PHOSPHORUS: Phosphorus: 3.5 mg/dL (ref 2.5–4.6)

## 2024-01-15 LAB — CBC
HCT: 33.6 % — ABNORMAL LOW (ref 36.0–46.0)
Hemoglobin: 11.6 g/dL — ABNORMAL LOW (ref 12.0–15.0)
MCH: 30.4 pg (ref 26.0–34.0)
MCHC: 34.5 g/dL (ref 30.0–36.0)
MCV: 88 fL (ref 80.0–100.0)
Platelets: 353 K/uL (ref 150–400)
RBC: 3.82 MIL/uL — ABNORMAL LOW (ref 3.87–5.11)
RDW: 12.3 % (ref 11.5–15.5)
WBC: 8.8 K/uL (ref 4.0–10.5)
nRBC: 0 % (ref 0.0–0.2)

## 2024-01-15 LAB — CBG MONITORING, ED
Glucose-Capillary: 199 mg/dL — ABNORMAL HIGH (ref 70–99)
Glucose-Capillary: 172 mg/dL — ABNORMAL HIGH (ref 70–99)

## 2024-01-15 LAB — VITAMIN D 25 HYDROXY (VIT D DEFICIENCY, FRACTURES): Vit D, 25-Hydroxy: 20.63 ng/mL — ABNORMAL LOW (ref 30–100)

## 2024-01-15 LAB — GLUCOSE, CAPILLARY
Glucose-Capillary: 138 mg/dL — ABNORMAL HIGH (ref 70–99)
Glucose-Capillary: 146 mg/dL — ABNORMAL HIGH (ref 70–99)

## 2024-01-15 LAB — MAGNESIUM: Magnesium: 2 mg/dL (ref 1.7–2.4)

## 2024-01-15 MED ORDER — SODIUM CHLORIDE 0.9 % IV SOLN: INTRAVENOUS | Status: AC

## 2024-01-15 MED ORDER — SODIUM CHLORIDE 0.9 % IV SOLN
1.0000 g | INTRAVENOUS | Status: DC
  Administered 2024-01-15 – 2024-01-16 (×2): 1 g via INTRAVENOUS
  Filled 2024-01-15 (×3): qty 10

## 2024-01-15 NOTE — Progress Notes (Signed)
 Progress Note   Patient: Shelly Parks FMW:[id number] DOB: 1941-03-17 DOA: 01/14/2024     0 DOS: the patient was seen and examined on 01/15/2024   Brief hospital course:  Shelly Parks is a 83 y.o. female with past medical history of HTN, HLD, prediabetic, COPD, depression, dementia as reviewed from EMR, patient is very poor historian, AAO x 1.  EMR reviewed, got information from patient's daughter at bedside and from ED attending.  As per patient's daughter she is feeling generalized weakness for past few days to the point that she is unable to ambulate.  No new specific complaints. On further questioning she did mention polyuria and polydipsia, ED workup showed hyperglycemia.  My suspicion is that she may be having all the symptoms due to dehydration and hyperglycemia.   ED Course: VS afebrile, HR 98, RR 16, BP 116/61, 100% on room air BMP: Na 130, K3.3, blood glucose 306 elevated BUN 31 CBC: Hb 10.8, rest within normal range CT head: Negative for any acute stroke.  Chronic ischemic microvascular disease.     Assessment and Plan:  Principal Problem:   Weakness generalized     # Hyperglycemia due to diabetes mellitus, new onset History of prediabetes Presented with generalized weakness, polyuria and polydipsia. Serum glucose 306 Continue IV fluid for hydration Hemoglobin A1c 6.2 Continue NovoLog  sliding scale Maintain consistent carbohydrate diet   Urinary tract infection Patient with urinary incontinence and foul-smelling urine UA shows pyuria and urine culture is pending Continue empiric antibiotic therapy with Rocephin  while awaiting results of urine culture to complete a 3 to 5-day course of therapy   Acute metabolic encephalopathy Secondary to UTI, hyperglycemia and electrolyte abnormalities Patient at baseline has dementia but according to her daughter and caregiver she is more confused and has difficulty ambulating. At baseline she does not require an assistive  device Expect improvement in patient's mental status following resolution of acute illness     # Hyponatremia  Most likely due to hyperglycemia Improved with IV fluid hydration but not back to baseline Continue IV fluid Check BMP daily   # Hypokalemia Secondary to diuretic therapy.  Patient was on hydrochlorothiazide  Potassium repleted. Magnesium  within normal range Check phosphorus level   # HTN, HLD, history of prior CVA No residual weakness Resumed aspirin  Continue amlodipine  Hold hydrochlorothiazide  and lisinopril  due to dehydration and electrolyte imbalance    # COPD, no exacerbation noticed on exam Home inhaler switch to Breo Ellipta  inhaler Continue albuterol  as needed     # Generalized weakness and difficulty ambulation due to above Continue fall precautions, turn patient every 2 hourly Ambulate with assistance Appreciate PT input, they recommend SNF on discharge     DVT Prophylaxis: Subcutaneous Lovenox         Subjective: No new complaints  Physical Exam: Vitals:   01/15/24 1127 01/15/24 1131 01/15/24 1228 01/15/24 1229  BP:  (!) 142/65  (!) 141/67  Pulse:  82  81  Resp:   18 18  Temp: (!) 97.4 F (36.3 C)   98.3 F (36.8 C)  TempSrc:    Oral  SpO2:  99%  100%  Weight:      Height:       General: alert and AAOx 1. Appear in no acute distress, affect appropriate, frail Eyes: PERRLA, Conjunctiva normal ENT: Oral Mucosa Clear, moist  Neck: no JVD, no Abnormal Mass Or lumps Cardiovascular: S1 and S2 Present, no Murmur, peripheral pulses symmetrical Respiratory: good respiratory effort, Bilateral Air entry equal  Abdomen: Bowel Sound present, Soft and no tenderness, no hernia Skin: no rashes  Extremities: no Pedal edema, no calf tenderness Neurologic: without any new focal findings Gait not checked due to patient safety concerns   Data Reviewed: Sodium 131, creatinine 1.05, hemoglobin 11.6, A1c 6.2, UA pyuria Labs reviewed  Family  Communication: Plan of care discussed with patient's daughter at the bedside.  She verbalizes understanding and agrees with the plan.  Disposition: Status is: Observation The patient remains OBS appropriate and will d/c before 2 midnights.  Planned Discharge Destination: Skilled nursing facility    Time spent: 40 minutes  Author: Aimee Somerset, MD 01/15/2024 1:16 PM  For on call review [email or web address].

## 2024-01-15 NOTE — Care Management Obs Status (Signed)
 MEDICARE OBSERVATION STATUS NOTIFICATION   Patient Details  Name: Shelly Parks MRN: [id number] Date of Birth: 09/26/1940   Medicare Observation Status Notification Given:  Yes    Edsel DELENA Fischer, LCSW 01/15/2024, 10:50 AM

## 2024-01-15 NOTE — Progress Notes (Signed)
 Patient refused highest level of mobility. Patient states she doesn't want to get out of bed now. Patient's daughter at bedside and states she is the power of attorney. States mother should only get up if she feels up to it.

## 2024-01-15 NOTE — ED Notes (Signed)
 This RN assisted the patient to the restroom with a walker and 2 person assist at this time. New brief and gown placed on patient and warm blankets provided.

## 2024-01-15 NOTE — Evaluation (Signed)
 Occupational Therapy Evaluation Patient Details Name: Shelly Parks MRN: [id number] DOB: Aug 04, 1940 Today's Date: 01/15/2024   History of Present Illness   83 y.o. female with Past medical history of HTN, HLD, prediabetic, COPD, depression, and possible dementia.  Per daughter pt was feeling generalized weakness, polyuria, and polydipsia for past few days to the point that she is unable to ambulate.     Clinical Impressions Pt was seen for OT evaluation this date. 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 At baseline, she is very social and talkative. Dtr notes that she has been uncharacteristically quiet, weaker, and most recently unable to ambulate the past couple days with difficulty with urinary incontinents and urge. Pt presents to acute OT demonstrating impaired ADL performance and functional mobility 2/2 decreased strength, balance, activity tolerance, and worsening cognition (See OT problem list for additional functional deficits). Pt currently requires MIN A for bed mobility, MIN A +2 for safety for STS with RW and MAX VC for initiation/sequencing to take a couple shuffled side steps EOB. Limited by short IV attached to the bed. Pt would benefit from skilled OT services to address noted impairments and functional limitations (see below for any additional details) in order to maximize safety and independence while minimizing falls risk and caregiver burden. Anticipate the need for follow up OT services upon acute hospital DC.      If plan is discharge home, recommend the following:   A little help with walking and/or transfers;A lot of help with bathing/dressing/bathroom;Direct supervision/assist for medications management;Supervision due to cognitive status;Direct supervision/assist for financial management;Assist for  transportation;Assistance with cooking/housework;Help with stairs or ramp for entrance     Functional Status Assessment   Patient has had a recent decline in their functional status and demonstrates the ability to make significant improvements in function in a reasonable and predictable amount of time.     Equipment Recommendations   Other (comment) (defer)     Recommendations for Other Services         Precautions/Restrictions   Precautions Precautions: Fall Recall of Precautions/Restrictions: Impaired Restrictions Weight Bearing Restrictions Per Provider Order: No     Mobility Bed Mobility Overal bed mobility: Needs Assistance Bed Mobility: Supine to Sit, Sit to Supine     Supine to sit: Min assist, HOB elevated, Used rails Sit to supine: Min assist   General bed mobility comments: MIN A for hips shifting to improve sitting balance EOB, MIN A forBLE mgt back to bed    Transfers Overall transfer level: Needs assistance Equipment used: Rolling walker (2 wheels) Transfers: Sit to/from Stand Sit to Stand: Min assist, +2 safety/equipment           General transfer comment: daughter present      Balance Overall balance assessment: Needs assistance Sitting-balance support: Feet supported, Single extremity supported Sitting balance-Leahy Scale: Fair     Standing balance support: Bilateral upper extremity supported, Reliant on assistive device for balance Standing balance-Leahy Scale: Poor                             ADL either performed or assessed with clinical judgement   ADL  General ADL Comments: Pt currently requires grossly MIN A for UB and MIN-MOD A for LB ADL tasks, MIN A for STS ADL transfers with RW, cues to assist with sequencing, safety, initiation     Vision         Perception         Praxis         Pertinent Vitals/Pain Pain Assessment Pain Assessment:  Faces Faces Pain Scale: No hurt     Extremity/Trunk Assessment Upper Extremity Assessment Upper Extremity Assessment: Generalized weakness   Lower Extremity Assessment Lower Extremity Assessment: Generalized weakness       Communication Communication Communication: Impaired Factors Affecting Communication: Hearing impaired;Difficulty expressing self   Cognition Arousal: Alert Behavior During Therapy: Flat affect Cognition: History of cognitive impairments             OT - Cognition Comments: per dtr pt typically alert and oriented, talkative, and social; has been very quiet                 Following commands: Impaired Following commands impaired: Follows one step commands with increased time, Follows multi-step commands inconsistently     Cueing  General Comments   Cueing Techniques: Verbal cues;Tactile cues      Exercises     Shoulder Instructions      Home Living Family/patient expects to be discharged to:: Private residence Living Arrangements: Children (dtr)   Type of Home: House Home Access: Stairs to enter [employer] of Steps: 4-5 Entrance Stairs-Rails: Can reach both;Left;Right Home Layout: One level     Bathroom Shower/Tub: Tub/shower unit;Sponge bathes at baseline         Home Equipment: Rollator (4 wheels);BSC/3in1          Prior Functioning/Environment Prior Level of Function : Needs assist;Patient poor historian/Family not available             Mobility Comments: per dtr indep until recently and then tried using rollator unsuccessfully ADLs Comments: per dtr assist for bathing, indep otherwise with basic ADL, 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; daughter manages meds, meals, cleaning, friend drives both pt/dtr; recently she has been sleeping during the day and not sleeping at night    OT Problem List: Decreased strength;Decreased cognition;Decreased safety awareness;Decreased activity  tolerance;Impaired balance (sitting and/or standing);Decreased knowledge of use of DME or AE   OT Treatment/Interventions: Self-care/ADL training;Therapeutic exercise;Therapeutic activities;Cognitive remediation/compensation;DME and/or AE instruction;Energy conservation;Patient/family education;Balance training      OT Goals(Current goals can be found in the care plan section)   Acute Rehab OT Goals Patient Stated Goal: dtr wants her to get better OT Goal Formulation: With patient/family Time For Goal Achievement: 01/29/24 Potential to Achieve Goals: Good ADL Goals Pt Will Perform Upper Body Dressing: sitting;with supervision;with set-up Pt Will Perform Lower Body Dressing: sit to/from stand;with supervision;with set-up Pt Will Transfer to Toilet: with supervision;ambulating (LRAD) Pt Will Perform Toileting - Clothing Manipulation and hygiene: sitting/lateral leans;with supervision Additional ADL Goal #1: Pt will follow 100% of simple commands with cues during ADL/mobility tasks, 3/3 opportunities to maximize safety.   OT Frequency:  Min 2X/week    Co-evaluation              AM-PAC OT 6 Clicks Daily Activity     Outcome Measure Help from another person eating meals?: None Help from another person taking care of personal grooming?: A Little Help from another person toileting, which includes using toliet, bedpan, or urinal?: A Lot  Help from another person bathing (including washing, rinsing, drying)?: A Lot Help from another person to put on and taking off regular upper body clothing?: A Little Help from another person to put on and taking off regular lower body clothing?: A Lot 6 Click Score: 16   End of Session Equipment Utilized During Treatment: Rolling walker (2 wheels) Nurse Communication: Other (comment) (pt needs assist with brief and gown change - unable to take off 2/2 IV)  Activity Tolerance: Patient tolerated treatment well Patient left: in bed;with call  bell/phone within reach;with bed alarm set;with family/visitor present;Other (comment) (MD in room)  OT Visit Diagnosis: Other abnormalities of gait and mobility (R26.89);Muscle weakness (generalized) (M62.81);Other symptoms and signs involving cognitive function                Time: 9094-9068 OT Time Calculation (min): 26 min Charges:  OT General Charges $OT Visit: 1 Visit OT Evaluation $OT Eval Low Complexity: 1 Low OT Treatments $Therapeutic Activity: 8-22 mins  Warren SAUNDERS., MPH, MS, OTR/L ascom [phone] 01/15/24, 10:04 AM

## 2024-01-15 NOTE — ED Notes (Signed)
 Lab states they have urine culture- got it yesterday

## 2024-01-15 NOTE — Evaluation (Signed)
 Physical Therapy Evaluation Patient Details Name: Shelly Parks MRN: [id number] DOB: [birth year] Today's Date: 01/15/2024  History of Present Illness  83 y.o. female with Past medical history of HTN, HLD, prediabetic, COPD, depression, and possible dementia.  Per daughter pt was feeling generalized weakness, polyuria, and polydipsia for past few days to the point that she is unable to ambulate.  Clinical Impression  Pt received in supine position and agreeable to therapy. Pt unable to provide any home information and daughter not present in room, so information obtained from OT evaluation.  Pt is able to perform commands without any difficulty, however does not speak much throughout the session.  Pt needing supervision for bed mobility and then required CGA when ambulating.  Pt with very slow and deliberate steps, with narrow BOS, and shortened strides throughout.  Pt noted to be fatiguing when ambulating to the nursing station and requested to return back to the room due to the fatigue.  PT also noted to have voided in her brief.  This was discarded and nursing staff notified of incontinence and likely need for purewick.  Pt left with all need met and call bell within reach.       If plan is discharge home, recommend the following: A lot of help with walking and/or transfers;A lot of help with bathing/dressing/bathroom;Assistance with cooking/housework;Assist for transportation;Help with stairs or ramp for entrance   Can travel by private vehicle   Yes    Equipment Recommendations None recommended by PT  Recommendations for Other Services       Functional Status Assessment Patient has had a recent decline in their functional status and demonstrates the ability to make significant improvements in function in a reasonable and predictable amount of time.     Precautions / Restrictions Precautions Precautions: Fall Recall of Precautions/Restrictions: Impaired Restrictions Weight Bearing  Restrictions Per Provider Order: No      Mobility  Bed Mobility Overal bed mobility: Needs Assistance Bed Mobility: Supine to Sit, Sit to Supine     Supine to sit: HOB elevated, Used rails, Supervision Sit to supine: Supervision, Min assist   General bed mobility comments: Pt able to get in/out of bed with supervision, however when adjusting her LE's, pt requires minA to get straightened in the bed.    Transfers Overall transfer level: Needs assistance Equipment used: Rolling walker (2 wheels) Transfers: Sit to/from Stand Sit to Stand: Supervision, Contact guard assist           General transfer comment: CGA for standing from bed.    Ambulation/Gait Ambulation/Gait assistance: Contact guard assist Gait Distance (Feet): 40 Feet Assistive device: Rolling walker (2 wheels) Gait Pattern/deviations: Decreased step length - right, Decreased step length - left, Narrow base of support Gait velocity: decreased     General Gait Details: Pt with very short strides and slow and deliberate movements.  Pt fatigue after ambulating ~20' and requesting to come back to bed.  Stairs            Wheelchair Mobility     Tilt Bed    Modified Rankin (Stroke Patients Only)       Balance Overall balance assessment: Needs assistance Sitting-balance support: Feet supported, Single extremity supported Sitting balance-Leahy Scale: Fair     Standing balance support: Bilateral upper extremity supported, Reliant on assistive device for balance Standing balance-Leahy Scale: Poor  Pertinent Vitals/Pain Pain Assessment Pain Assessment: No/denies pain    Home Living Family/patient expects to be discharged to:: Private residence Living Arrangements: Children (dtr)   Type of Home: House Home Access: Stairs to enter Entrance Stairs-Rails: Can reach both;Left;Right Entrance Stairs-Number of Steps: 4-5   Home Layout: One level Home  Equipment: Rollator (4 wheels);BSC/3in1      Prior Function Prior Level of Function : Needs assist;Patient poor historian/Family not available             Mobility Comments: per dtr indep until recently and then tried using rollator unsuccessfully ADLs Comments: per dtr assist for bathing, indep otherwise with basic ADL, 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; daughter manages meds, meals, cleaning, friend drives both pt/dtr; recently she has been sleeping during the day and not sleeping at night     Extremity/Trunk Assessment   Upper Extremity Assessment Upper Extremity Assessment: Generalized weakness    Lower Extremity Assessment Lower Extremity Assessment: Generalized weakness       Communication   Communication Communication: Impaired Factors Affecting Communication: Hearing impaired;Difficulty expressing self    Cognition Arousal: Alert Behavior During Therapy: Flat affect                             Following commands: Impaired Following commands impaired: Follows one step commands with increased time, Follows multi-step commands inconsistently     Cueing Cueing Techniques: Verbal cues, Tactile cues     General Comments      Exercises     Assessment/Plan    PT Assessment Patient needs continued PT services  PT Problem List Decreased strength;Decreased activity tolerance;Decreased balance;Decreased mobility;Decreased cognition;Decreased knowledge of use of DME;Decreased safety awareness       PT Treatment Interventions DME instruction;Gait training;Stair training;Therapeutic activities;Therapeutic exercise;Balance training;Neuromuscular re-education;Patient/family education    PT Goals (Current goals can be found in the Care Plan section)  Acute Rehab PT Goals Patient Stated Goal: not at this time. PT Goal Formulation: Patient unable to participate in goal setting Time For Goal Achievement: 01/29/24 Potential to  Achieve Goals: Fair    Frequency Min 2X/week     Co-evaluation               AM-PAC PT 6 Clicks Mobility  Outcome Measure Help needed turning from your back to your side while in a flat bed without using bedrails?: A Little Help needed moving from lying on your back to sitting on the side of a flat bed without using bedrails?: A Little Help needed moving to and from a bed to a chair (including a wheelchair)?: A Little Help needed standing up from a chair using your arms (e.g., wheelchair or bedside chair)?: A Little Help needed to walk in hospital room?: A Little Help needed climbing 3-5 steps with a railing? : Total 6 Click Score: 16    End of Session Equipment Utilized During Treatment: Gait belt Activity Tolerance: Patient tolerated treatment well;Patient limited by fatigue Patient left: in bed;with call bell/phone within reach;with bed alarm set Nurse Communication: Mobility status PT Visit Diagnosis: Unsteadiness on feet (R26.81);Other abnormalities of gait and mobility (R26.89);Muscle weakness (generalized) (M62.81);Difficulty in walking, not elsewhere classified (R26.2)    Time: 8691-8677 PT Time Calculation (min) (ACUTE ONLY): 14 min   Charges:   PT Evaluation $PT Eval Low Complexity: 1 Low   PT General Charges $$ ACUTE PT VISIT: 1 Visit  Fonda Simpers, PT, DPT Physical Therapist - [facility]  01/15/24, 4:02 PM

## 2024-01-16 ENCOUNTER — Observation Stay

## 2024-01-16 DIAGNOSIS — R531 Weakness: Secondary | ICD-10-CM | POA: Diagnosis not present

## 2024-01-16 LAB — CBC
HCT: 32.7 % — ABNORMAL LOW (ref 36.0–46.0)
Hemoglobin: 11.2 g/dL — ABNORMAL LOW (ref 12.0–15.0)
MCH: 30.5 pg (ref 26.0–34.0)
MCHC: 34.3 g/dL (ref 30.0–36.0)
MCV: 89.1 fL (ref 80.0–100.0)
Platelets: 388 K/uL (ref 150–400)
RBC: 3.67 MIL/uL — ABNORMAL LOW (ref 3.87–5.11)
RDW: 12.6 % (ref 11.5–15.5)
WBC: 18.5 K/uL — ABNORMAL HIGH (ref 4.0–10.5)
nRBC: 0 % (ref 0.0–0.2)

## 2024-01-16 LAB — BASIC METABOLIC PANEL WITH GFR
Anion gap: 10 (ref 5–15)
BUN: 43 mg/dL — ABNORMAL HIGH (ref 8–23)
CO2: 21 mmol/L — ABNORMAL LOW (ref 22–32)
Calcium: 9.2 mg/dL (ref 8.9–10.3)
Chloride: 105 mmol/L (ref 98–111)
Creatinine, Ser: 1.2 mg/dL — ABNORMAL HIGH (ref 0.44–1.00)
GFR, Estimated: 45 mL/min — ABNORMAL LOW (ref >60)
Glucose, Bld: 203 mg/dL — ABNORMAL HIGH (ref 70–99)
Potassium: 3.2 mmol/L — ABNORMAL LOW (ref 3.5–5.1)
Sodium: 136 mmol/L (ref 135–145)

## 2024-01-16 LAB — RESP PANEL BY RT-PCR (RSV, FLU A&B, COVID)  RVPGX2
Influenza A by PCR: NEGATIVE
Influenza B by PCR: NEGATIVE
Resp Syncytial Virus by PCR: NEGATIVE
SARS Coronavirus 2 by RT PCR: NEGATIVE

## 2024-01-16 LAB — GLUCOSE, CAPILLARY
Glucose-Capillary: 199 mg/dL — ABNORMAL HIGH (ref 70–99)
Glucose-Capillary: 188 mg/dL — ABNORMAL HIGH (ref 70–99)
Glucose-Capillary: 195 mg/dL — ABNORMAL HIGH (ref 70–99)
Glucose-Capillary: 207 mg/dL — ABNORMAL HIGH (ref 70–99)

## 2024-01-16 LAB — MAGNESIUM: Magnesium: 1.9 mg/dL (ref 1.7–2.4)

## 2024-01-16 LAB — URINE CULTURE: Culture: >=100000 — AB

## 2024-01-16 LAB — PHOSPHORUS: Phosphorus: 3.6 mg/dL (ref 2.5–4.6)

## 2024-01-16 MED ORDER — POLYSACCHARIDE IRON COMPLEX 150 MG PO CAPS
150.0000 mg | ORAL_CAPSULE | Freq: Every day | ORAL | Status: DC
  Filled 2024-01-16: qty 1

## 2024-01-16 MED ORDER — VITAMIN C 500 MG PO TABS
500.0000 mg | ORAL_TABLET | Freq: Every day | ORAL | Status: DC
  Administered 2024-01-17 – 2024-01-21 (×5): 500 mg via ORAL
  Filled 2024-01-16 (×6): qty 1

## 2024-01-16 MED ORDER — ARFORMOTEROL TARTRATE 15 MCG/2ML IN NEBU
15.0000 ug | INHALATION_SOLUTION | Freq: Two times a day (BID) | RESPIRATORY_TRACT | Status: DC
  Administered 2024-01-16 – 2024-01-21 (×10): 15 ug via RESPIRATORY_TRACT
  Filled 2024-01-16 (×11): qty 2

## 2024-01-16 MED ORDER — IRON SUCROSE 300 MG IVPB - SIMPLE MED
300.0000 mg | Freq: Once | Status: AC
  Administered 2024-01-16: 300 mg via INTRAVENOUS
  Filled 2024-01-16: qty 300

## 2024-01-16 MED ORDER — VITAMIN D (ERGOCALCIFEROL) 1.25 MG (50000 UNIT) PO CAPS
50000.0000 [IU] | ORAL_CAPSULE | ORAL | Status: DC
  Filled 2024-01-16: qty 1

## 2024-01-16 MED ORDER — ACETAMINOPHEN 10 MG/ML IV SOLN
1000.0000 mg | Freq: Once | INTRAVENOUS | Status: AC
  Administered 2024-01-16: 1000 mg via INTRAVENOUS
  Filled 2024-01-16: qty 100

## 2024-01-16 MED ORDER — CHLORHEXIDINE GLUCONATE CLOTH 2 % EX PADS
6.0000 | MEDICATED_PAD | Freq: Every day | CUTANEOUS | Status: DC
  Administered 2024-01-16 – 2024-01-21 (×6): 6 via TOPICAL

## 2024-01-16 MED ORDER — BUDESONIDE 0.25 MG/2ML IN SUSP
0.2500 mg | Freq: Two times a day (BID) | RESPIRATORY_TRACT | Status: DC
  Administered 2024-01-16 – 2024-01-21 (×10): 0.25 mg via RESPIRATORY_TRACT
  Filled 2024-01-16 (×10): qty 2

## 2024-01-16 MED ORDER — POTASSIUM CHLORIDE 20 MEQ PO PACK
40.0000 meq | PACK | ORAL | Status: AC
  Administered 2024-01-16 (×2): 40 meq via ORAL
  Filled 2024-01-16 (×3): qty 2

## 2024-01-16 MED ORDER — ENOXAPARIN SODIUM 30 MG/0.3ML IJ SOSY
30.0000 mg | PREFILLED_SYRINGE | INTRAMUSCULAR | Status: DC
  Administered 2024-01-16 – 2024-01-17 (×2): 30 mg via SUBCUTANEOUS
  Filled 2024-01-16 (×2): qty 0.3

## 2024-01-16 MED ORDER — IPRATROPIUM-ALBUTEROL 0.5-2.5 (3) MG/3ML IN SOLN: 3.0000 mL | Freq: Four times a day (QID) | RESPIRATORY_TRACT | Status: DC | PRN

## 2024-01-16 NOTE — Progress Notes (Signed)
 Daughter notified that patient will be moving to room 118 once cleaned.

## 2024-01-16 NOTE — Plan of Care (Signed)
 Palliative consult received.  Visited with [name] at her bedside, she opens her eyes to calling of her name but cannot participate in goals of care conversations due to metabolic encephalopathy as well as underlying dementia. No family at bedside during time of visit.  Called and spoke with daughter-Shelly Parks, plan set to meet at bedside 7/13 at 2pm.  No Charge.  Waddell Lesches, DNP, AGNP-C Palliative Medicine  Please call Palliative Medicine team phone with any questions [phone]. For individual providers please see AMION.

## 2024-01-16 NOTE — Progress Notes (Signed)
 Progress Note   Patient: Shelly Parks FMW:[id number] DOB: 08-22-1940 DOA: 01/14/2024     0 DOS: the patient was seen and examined on 01/16/2024   Brief hospital course:  Shelly Parks is a 83 y.o. female with past medical history of HTN, HLD, prediabetic, COPD, depression, dementia as reviewed from EMR, patient is very poor historian, AAO x 1.  EMR reviewed, got information from patient's daughter at bedside and from ED attending.  As per patient's daughter she is feeling generalized weakness for past few days to the point that she is unable to ambulate.  No new specific complaints. On further questioning she did mention polyuria and polydipsia, ED workup showed hyperglycemia.  My suspicion is that she may be having all the symptoms due to dehydration and hyperglycemia.   ED Course: VS afebrile, HR 98, RR 16, BP 116/61, 100% on room air BMP: Na 130, K3.3, blood glucose 306 elevated BUN 31 CBC: Hb 10.8, rest within normal range CT head: Negative for any acute stroke.  Chronic ischemic microvascular disease.     Assessment and Plan:  Principal Problem:   Weakness generalized     # Hyperglycemia due to diabetes mellitus, new onset History of prediabetes Presented with generalized weakness, polyuria and polydipsia. Serum glucose 306 Continue IV fluid for hydration Hemoglobin A1c 6.2 Continue NovoLog  sliding scale Maintain consistent carbohydrate diet   # Urinary tract infection Patient with urinary incontinence and foul-smelling urine UA shows pyuria and urine culture growing E. coli Continue ceftriaxone  Follow urine culture sensitivity report    # Acute metabolic encephalopathy Secondary to UTI, hyperglycemia and electrolyte abnormalities Patient at baseline has dementia but according to her daughter and caregiver she is more confused and has difficulty ambulating. At baseline she does not require an assistive device Expect improvement in patient's mental status following  resolution of acute illness     # Hyponatremia  Most likely due to hyperglycemia Improved with IV fluid hydration but not back to baseline Continue IV fluid Check BMP daily   # Hypokalemia Secondary to diuretic therapy.  Patient was on hydrochlorothiazide  Potassium repleted. Magnesium  within normal range Check phosphorus level   # HTN, HLD, history of prior CVA No residual weakness Resumed aspirin  Continue amlodipine  Hold hydrochlorothiazide  and lisinopril  due to dehydration and electrolyte imbalance    # COPD, no exacerbation noticed on exam Home inhaler switch to Breo Ellipta  inhaler Continue albuterol  as needed     # Generalized weakness and difficulty ambulation due to above Continue fall precautions, turn patient every 2 hourly Ambulate with assistance Appreciate PT input, they recommend SNF on discharge     # Vitamin D  deficiency: started vitamin D  50,000 units p.o. weekly, follow with PCP to repeat vitamin D  level after 3 to 6 months.  # Iron  deficiency, transferrin 08% Patient has very poor oral intake and unable to swallow pills 7/12 Venofer  3 mg IV one-time dose given Patient will benefit from oral iron  supplement on discharge if oral intake improves  DVT Prophylaxis: Subcutaneous Lovenox     Subjective: No significant events overnight, patient still remained confused and demented, AO x 1.  Patient was resting calmly, Management plan discussed with patient's daughter at bedside.   Physical Exam: Vitals:   01/15/24 1947 01/16/24 0348 01/16/24 0752 01/16/24 1609  BP: (!) 135/53 121/73 136/83 (!) 151/75  Pulse: 85 98 98 (!) 115  Resp: 18 20 (!) 21 (!) 22  Temp: 98.7 F (37.1 C) (!) 100.7 F (38.2 C) 98.9  F (37.2 C) 99.1 F (37.3 C)  TempSrc:   Axillary Axillary  SpO2: 99% 100% 98% 100%  Weight:      Height:       General: AAOx 1. Appear in no acute distress, affect appropriate, frail Eyes: PERRLA, Conjunctiva normal ENT: Oral Mucosa Clear,  moist  Neck: no JVD Cardiovascular: S1 and S2 Present, no Murmur,  Respiratory: good respiratory effort, Bilateral Air entry equal Abdomen: Bowel Sound present, Soft and no tenderness,  Skin: no rashes  Extremities: no Pedal edema, no calf tenderness Neurologic: without any new focal findings Gait not checked due to patient safety concerns   Data Reviewed: Sodium 136, K 3.2, creatinine 1.20, wbc 18.5, A1c 6.2,  Ucx growing E. coli Labs reviewed  Family Communication: Plan of care discussed with patient's daughter at the bedside.  She verbalizes understanding and agrees with the plan.  Disposition: Status is: Observation The patient remains OBS appropriate and will d/c before 2 midnights.  Planned Discharge Destination: Skilled nursing facility    Time spent:  minutes  Author: Elvan Sor, MD 01/16/2024 4:25 PM  For on call review [email or web address].

## 2024-01-16 NOTE — Progress Notes (Signed)
 Pt yellow mews. MD notified. Continuing to monitor   01/16/24 1609  Assess: MEWS Score  Temp 99.1 F (37.3 C)  BP (!) 151/75  MAP (mmHg) 95  Pulse Rate (!) 115  Resp (!) 22  SpO2 100 %  Assess: MEWS Score  MEWS Temp 0  MEWS Systolic 0  MEWS Pulse 2  MEWS RR 1  MEWS LOC 0  MEWS Score 3  MEWS Score Color Yellow  Assess: if the MEWS score is Yellow or Red  Were vital signs accurate and taken at a resting state? Yes  Does the patient meet 2 or more of the SIRS criteria? Yes  Does the patient have a confirmed or suspected source of infection? Yes  MEWS guidelines implemented  Yes, yellow  Treat  MEWS Interventions Considered administering scheduled or prn medications/treatments as ordered  Take Vital Signs  Increase Vital Sign Frequency  Yellow: Q2hr x1, continue Q4hrs until patient remains green for 12hrs  Escalate  MEWS: Escalate Yellow: Discuss with charge nurse and consider notifying provider and/or RRT  Notify: Charge Nurse/RN  Name of Charge Nurse/RN Notified Catheryn Ned, RN  Provider Notification  Provider Name/Title Von BIRCH., MD  Date Provider Notified 01/16/24  Time Provider Notified [phone]  Notification Reason Other (Comment) (yellow mews)  Assess: SIRS CRITERIA  SIRS Temperature  0  SIRS Respirations  1  SIRS Pulse 1  SIRS WBC 1  SIRS Score Sum  3

## 2024-01-16 NOTE — Plan of Care (Signed)
   Problem: Coping: Goal: Ability to adjust to condition or change in health will improve Outcome: Progressing   Problem: Metabolic: Goal: Ability to maintain appropriate glucose levels will improve Outcome: Progressing   Problem: Skin Integrity: Goal: Risk for impaired skin integrity will decrease Outcome: Progressing   Problem: Education: Goal: Knowledge of General Education information will improve Description: Including pain rating scale, medication(s)/side effects and non-pharmacologic comfort measures Outcome: Progressing

## 2024-01-16 NOTE — Evaluation (Addendum)
 Clinical/Bedside Swallow Evaluation Patient Details  Name: Shelly Parks MRN: [id number] Date of Birth: 08/18/1940  Today's Date: 01/16/2024 Time: SLP Start Time (ACUTE ONLY): 1205 SLP Stop Time (ACUTE ONLY): 1245 SLP Time Calculation (min) (ACUTE ONLY): 40 min  Past Medical History:  Past Medical History:  Diagnosis Date   Anemia    Asthma    COPD (chronic obstructive pulmonary disease) (HCC)    Diabetes mellitus without complication (HCC)    HLD (hyperlipidemia)    HTN (hypertension), benign    Incontinence of urine in female    Major depressive disorder, single episode, unspecified    Reflux    Past Surgical History:  Past Surgical History:  Procedure Laterality Date   BLADDER SURGERY  1969   BREAST BIOPSY Right    long ago pt ? date   BREAST SURGERY  1973   HPI:  Per MD progress note, Shelly Parks is a 83 y.o. female with past medical history of HTN, HLD, prediabetic, COPD, depression, dementia as reviewed from EMR, patient is very poor historian, AAO x 1.  EMR reviewed, got information from patient's daughter at bedside and from ED attending.  As per patient's daughter she is feeling generalized weakness for past few days to the point that she is unable to ambulate.  No new specific complaints.  On further questioning she did mention polyuria and polydipsia, ED workup showed hyperglycemia.  My suspicion is that she may be having all the symptoms due to dehydration and hyperglycemia. CT Head No acute intracranial abnormality, specifically, no acute  hemorrhage, territorial infarction, or intracranial mass.  2. Global cerebral volume loss with sequelae of advanced chronic  ischemic microvascular disease.    Assessment / Plan / Recommendation  Clinical Impression  Pt seen for bedside swallow assessment in the setting of reported oral holding/pocketing for medications and solids. Pt with baseline dementia, with current mentation further exacerbated by acute metabolic  encephalopathy. Per daughter, pt's baseline is grossly regular diet with no difficulty taking medication and pt is able to self feed. Currently, MD recently switched pt to pureed diet and RN reports crushing medications.   Pt lethargic, requiring verbal/tactile cues for initial alertness. Edentulous- dentures no present for session. Pt afebrile, on room air, no chest imaging completed this date. Trials completed for thin liquids and puree. No overt or subtle s/sx pharyngeal dysphagia noted. No change to vocal quality across trials. However, oral phase significantly impaired, with notably reduced awareness/manipulation of bolus, leading to oral holding for solids/liquids and pocketing/residue for solids. Pt requiring max cues across PO trials for holding cup, suction on straw, opening mouth for oral acceptance/inspection of pocketing, and sustained alertness. Increase in oral clearance noted with alternating solids and liquids, use of empty spoon to facilitate dry swallow, extended time between trials, and frequent visual inspection for pocketing.  RN present to trial medication administration alternatives, with pt holding medication (whole and crushed) on lingual surface for greater than 5 minutes despite use of liquid wash/verbal cues. Eventual need to scrape medication from surface of tongue.   Education shared with pt's daughter regarding risk factors for aspiration and need for close monitoring for endurance and oral clearance during a meal. Daughter reported understanding.   Pt's mentation/lethargy, dependency for feeding, and severe cognitive feeding deficits increase pt's risk for aspiration. Recommend medications be administered via alternative means. Allow continuation of puree/thin diet with total supervision- STOP meal with onset of oral pocketing. Complete oral care after meal to aid oral  clearance. Strict aspiration precautions - slow rate, small bites, limit distractions, upright, and alert.  Suspect reduced endurance/engagement in task of eating- recommend dietician consult. MD reporting palliative is involved.   Suspect that improvements in overall medical status/encephalopathy will directly benefit pt's cognitive awareness for task of eating. SLP will continue to monitor to determine if additional services are warranted.   SLP Visit Diagnosis: Dysphagia, oral phase (R13.11) (related to dementia/encephalopathy)    Aspiration Risk  Moderate aspiration risk    Diet Recommendation   Dysphagia 1 (puree);Thin  Medication Administration: Via alternative means    Other  Recommendations Recommended Consults:  (nutrition, palliative) Oral Care Recommendations: Oral care QID;Oral care before and after PO;Staff/trained caregiver to provide oral care     Assistance Recommended at Discharge  Total assistance  Functional Status Assessment Patient has had a recent decline in their functional status and/or demonstrates limited ability to make significant improvements in function in a reasonable and predictable amount of time  Frequency and Duration min 1 x/week  1 week       Prognosis Prognosis for improved oropharyngeal function: Guarded Barriers to Reach Goals: Severity of deficits;Motivation;Cognitive deficits      Swallow Study   General Date of Onset: 01/16/24 HPI: Per MD progress note, Shelly Parks is a 83 y.o. female with past medical history of HTN, HLD, prediabetic, COPD, depression, dementia as reviewed from EMR, patient is very poor historian, AAO x 1.  EMR reviewed, got information from patient's daughter at bedside and from ED attending.  As per patient's daughter she is feeling generalized weakness for past few days to the point that she is unable to ambulate.  No new specific complaints.  On further questioning she did mention polyuria and polydipsia, ED workup showed hyperglycemia.  My suspicion is that she may be having all the symptoms due to dehydration and  hyperglycemia. CT Head No acute intracranial abnormality, specifically, no acute  hemorrhage, territorial infarction, or intracranial mass.  2. Global cerebral volume loss with sequelae of advanced chronic  ischemic microvascular disease. Type of Study: Bedside Swallow Evaluation Previous Swallow Assessment: none in chart Diet Prior to this Study: Dysphagia 1 (pureed);Thin liquids (Level 0) (recently changed by MD) Temperature Spikes Noted: No Respiratory Status: Room air History of Recent Intubation: No Behavior/Cognition: Confused;Lethargic/Drowsy;Distractible;Requires cueing Oral Cavity Assessment: Within Functional Limits Oral Care Completed by SLP: Yes Oral Cavity - Dentition: Edentulous (dentures not present for assessment) Vision:  (not assessed) Self-Feeding Abilities: Total assist (pt holding cup for liquids.) Patient Positioning: Upright in bed Baseline Vocal Quality: Low vocal intensity Volitional Cough: Cognitively unable to elicit Volitional Swallow: Unable to elicit    Oral/Motor/Sensory Function Overall Oral Motor/Sensory Function: Generalized oral weakness   Ice Chips Ice chips: Not tested   Thin Liquid Thin Liquid: Impaired Presentation: Straw;Cup Oral Phase Impairments: Poor awareness of bolus Oral Phase Functional Implications: Oral holding Pharyngeal  Phase Impairments:  (none)    Nectar Thick Nectar Thick Liquid: Not tested   Honey Thick Honey Thick Liquid: Not tested   Puree Puree: Impaired Presentation: Spoon Oral Phase Impairments: Poor awareness of bolus;Impaired mastication Oral Phase Functional Implications: Oral holding;Oral residue;Prolonged oral transit Pharyngeal Phase Impairments:  (none)   Solid     Solid: Not tested     Shelly Liat Mayol Clapp, MS, CCC-SLP Speech Language Pathologist Rehab Services; [facility] [place] [phone] (ascom)   Shelly Parks 01/16/2024,1:05 PM

## 2024-01-17 DIAGNOSIS — G9341 Metabolic encephalopathy: Secondary | ICD-10-CM | POA: Diagnosis present

## 2024-01-17 DIAGNOSIS — F0280 Dementia in other diseases classified elsewhere without behavioral disturbance: Secondary | ICD-10-CM | POA: Diagnosis present

## 2024-01-17 DIAGNOSIS — E611 Iron deficiency: Secondary | ICD-10-CM | POA: Diagnosis present

## 2024-01-17 DIAGNOSIS — R32 Unspecified urinary incontinence: Secondary | ICD-10-CM | POA: Diagnosis present

## 2024-01-17 DIAGNOSIS — Z87891 Personal history of nicotine dependence: Secondary | ICD-10-CM | POA: Diagnosis not present

## 2024-01-17 DIAGNOSIS — R531 Weakness: Secondary | ICD-10-CM | POA: Diagnosis not present

## 2024-01-17 DIAGNOSIS — Z515 Encounter for palliative care: Secondary | ICD-10-CM

## 2024-01-17 DIAGNOSIS — J4489 Other specified chronic obstructive pulmonary disease: Secondary | ICD-10-CM | POA: Diagnosis present

## 2024-01-17 DIAGNOSIS — T502X5A Adverse effect of carbonic-anhydrase inhibitors, benzothiadiazides and other diuretics, initial encounter: Secondary | ICD-10-CM | POA: Diagnosis present

## 2024-01-17 DIAGNOSIS — E1165 Type 2 diabetes mellitus with hyperglycemia: Secondary | ICD-10-CM | POA: Diagnosis present

## 2024-01-17 DIAGNOSIS — N390 Urinary tract infection, site not specified: Secondary | ICD-10-CM | POA: Diagnosis present

## 2024-01-17 DIAGNOSIS — F0390 Unspecified dementia without behavioral disturbance: Secondary | ICD-10-CM

## 2024-01-17 DIAGNOSIS — R339 Retention of urine, unspecified: Secondary | ICD-10-CM | POA: Diagnosis not present

## 2024-01-17 DIAGNOSIS — N3000 Acute cystitis without hematuria: Secondary | ICD-10-CM | POA: Diagnosis not present

## 2024-01-17 DIAGNOSIS — R410 Disorientation, unspecified: Secondary | ICD-10-CM | POA: Diagnosis present

## 2024-01-17 DIAGNOSIS — I10 Essential (primary) hypertension: Secondary | ICD-10-CM | POA: Diagnosis present

## 2024-01-17 DIAGNOSIS — E559 Vitamin D deficiency, unspecified: Secondary | ICD-10-CM | POA: Diagnosis present

## 2024-01-17 DIAGNOSIS — E639 Nutritional deficiency, unspecified: Secondary | ICD-10-CM | POA: Diagnosis present

## 2024-01-17 DIAGNOSIS — Z7982 Long term (current) use of aspirin: Secondary | ICD-10-CM | POA: Diagnosis not present

## 2024-01-17 DIAGNOSIS — E860 Dehydration: Secondary | ICD-10-CM | POA: Diagnosis present

## 2024-01-17 DIAGNOSIS — Z87898 Personal history of other specified conditions: Secondary | ICD-10-CM | POA: Diagnosis not present

## 2024-01-17 DIAGNOSIS — N179 Acute kidney failure, unspecified: Secondary | ICD-10-CM | POA: Diagnosis present

## 2024-01-17 DIAGNOSIS — E785 Hyperlipidemia, unspecified: Secondary | ICD-10-CM | POA: Diagnosis present

## 2024-01-17 DIAGNOSIS — Z1152 Encounter for screening for COVID-19: Secondary | ICD-10-CM | POA: Diagnosis not present

## 2024-01-17 DIAGNOSIS — E876 Hypokalemia: Secondary | ICD-10-CM | POA: Diagnosis present

## 2024-01-17 DIAGNOSIS — R2689 Other abnormalities of gait and mobility: Secondary | ICD-10-CM | POA: Diagnosis present

## 2024-01-17 DIAGNOSIS — R310 Gross hematuria: Secondary | ICD-10-CM | POA: Diagnosis present

## 2024-01-17 DIAGNOSIS — Z8249 Family history of ischemic heart disease and other diseases of the circulatory system: Secondary | ICD-10-CM | POA: Diagnosis not present

## 2024-01-17 DIAGNOSIS — N3289 Other specified disorders of bladder: Secondary | ICD-10-CM | POA: Diagnosis present

## 2024-01-17 DIAGNOSIS — R262 Difficulty in walking, not elsewhere classified: Secondary | ICD-10-CM | POA: Diagnosis present

## 2024-01-17 LAB — BASIC METABOLIC PANEL WITH GFR
Anion gap: 10 (ref 5–15)
BUN: 52 mg/dL — ABNORMAL HIGH (ref 8–23)
CO2: 20 mmol/L — ABNORMAL LOW (ref 22–32)
Calcium: 8.7 mg/dL — ABNORMAL LOW (ref 8.9–10.3)
Chloride: 108 mmol/L (ref 98–111)
Creatinine, Ser: 1.59 mg/dL — ABNORMAL HIGH (ref 0.44–1.00)
GFR, Estimated: 32 mL/min — ABNORMAL LOW (ref >60)
Glucose, Bld: 172 mg/dL — ABNORMAL HIGH (ref 70–99)
Potassium: 3.1 mmol/L — ABNORMAL LOW (ref 3.5–5.1)
Sodium: 138 mmol/L (ref 135–145)

## 2024-01-17 LAB — RESPIRATORY PANEL BY PCR

## 2024-01-17 LAB — CBC
HCT: 30.1 % — ABNORMAL LOW (ref 36.0–46.0)
Hemoglobin: 10.4 g/dL — ABNORMAL LOW (ref 12.0–15.0)
MCH: 30.7 pg (ref 26.0–34.0)
MCHC: 34.6 g/dL (ref 30.0–36.0)
MCV: 88.8 fL (ref 80.0–100.0)
Platelets: 351 K/uL (ref 150–400)
RBC: 3.39 MIL/uL — ABNORMAL LOW (ref 3.87–5.11)
RDW: 12.7 % (ref 11.5–15.5)
WBC: 21.2 K/uL — ABNORMAL HIGH (ref 4.0–10.5)
nRBC: 0 % (ref 0.0–0.2)

## 2024-01-17 LAB — GLUCOSE, CAPILLARY
Glucose-Capillary: 131 mg/dL — ABNORMAL HIGH (ref 70–99)
Glucose-Capillary: 173 mg/dL — ABNORMAL HIGH (ref 70–99)
Glucose-Capillary: 138 mg/dL — ABNORMAL HIGH (ref 70–99)
Glucose-Capillary: 129 mg/dL — ABNORMAL HIGH (ref 70–99)

## 2024-01-17 LAB — PHOSPHORUS: Phosphorus: 3.6 mg/dL (ref 2.5–4.6)

## 2024-01-17 LAB — MAGNESIUM: Magnesium: 2.2 mg/dL (ref 1.7–2.4)

## 2024-01-17 MED ORDER — POTASSIUM CHLORIDE 10 MEQ/100ML IV SOLN
10.0000 meq | INTRAVENOUS | Status: AC
  Administered 2024-01-17 (×6): 10 meq via INTRAVENOUS
  Filled 2024-01-17: qty 100

## 2024-01-17 MED ORDER — SODIUM CHLORIDE 0.9 % IV SOLN
2.0000 g | INTRAVENOUS | Status: DC
  Administered 2024-01-17 – 2024-01-19 (×3): 2 g via INTRAVENOUS
  Filled 2024-01-17 (×4): qty 12.5

## 2024-01-17 MED ORDER — SODIUM CHLORIDE 0.9 % IV SOLN: INTRAVENOUS | Status: AC

## 2024-01-17 NOTE — Plan of Care (Signed)
  Problem: Education: Goal: Individualized Educational Video(s) Outcome: Progressing   Problem: Fluid Volume: Goal: Ability to maintain a balanced intake and output will improve Outcome: Progressing   Problem: Nutritional: Goal: Maintenance of adequate nutrition will improve Outcome: Progressing   Problem: Tissue Perfusion: Goal: Adequacy of tissue perfusion will improve Outcome: Progressing

## 2024-01-17 NOTE — Plan of Care (Signed)
  Problem: Education: Goal: Ability to describe self-care measures that may prevent or decrease complications (Diabetes Survival Skills Education) will improve Outcome: Progressing Goal: Individualized Educational Video(s) Outcome: Progressing   Problem: Coping: Goal: Ability to adjust to condition or change in health will improve Outcome: Progressing   Problem: Nutritional: Goal: Maintenance of adequate nutrition will improve Outcome: Progressing Goal: Progress toward achieving an optimal weight will improve Outcome: Progressing   Problem: Activity: Goal: Risk for activity intolerance will decrease Outcome: Progressing

## 2024-01-17 NOTE — Consult Note (Signed)
 Consultation Note Date: 01/17/2024   Patient Name: Shelly Parks  DOB: [birth year]  MRN: [id number]  Age / Sex: 83 y.o., female  PCP: Gerome Tillman CROME, FNP Referring Physician: Von Bellis, MD  Reason for Consultation: Establishing goals of care   HPI/Brief Hospital Course: 83 y.o. female  with past medical history of hypertension, hyperlipidemia, prediabetes, depression, dementia admitted from home on 01/14/2024 with generalized weakness as well as polyuria and polydipsia.  In the ED glucose 306, sodium 138 potassium 3.3 CT negative for acute processes but did reveal chronic ischemic microvascular disease  Admitted and being treated for UTI, acute metabolic encephalopathy and new onset diabetes  Palliative medicine was consulted for assisting with goals of care conversation  Subjective:  Extensive chart review has been completed prior to meeting patient including labs, vital signs, imaging, progress notes, orders, and available advanced directive documents from current and previous encounters.  Visited with Shelly Parks at her bedside.  She is awake, alert, able to state her name but unable to answer other orientation questions.  She is more awake and alert compared to yesterday's visit.  Daughter at bedside during time of visit.  Introduced myself as a Publishing rights manager as a member of the palliative care team. Explained palliative medicine is specialized medical care for people living with serious illness. It focuses on providing relief from the symptoms and stress of a serious illness. The goal is to improve quality of life for both the patient and the family.   Shelly Parks shares that she and her mother have lived together for several years.  At baseline Shelly Parks remains fairly independent.  She does require assistance from a neighbor with bathing but can complete other ADLs independently.  At baseline she walks without assistance.  Prior to admission, Shelly Parks shares Ms.  Parks appetite was not an issue.  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  Shelly Parks shares at baseline, Shelly Parks has difficulty remembering things on occasion but does not feel as though her dementia is advanced.  We discussed patient's current illness and what it means in the larger context of patient's on-going co-morbidities. Natural disease trajectory and expectations at EOL were discussed.   Shelly Parks shares she has been receiving regular updates from medical team and has been reading MyChart. She is aware Shelly Parks is being treated for UTI and hyperglycemia. Shelly Parks shares she was able to assist her mother with lunch and feels she tolerated it well. Hopeful with assistance with meals her appetite will return to normal.  Attempted to elicit goals of care. We discussed code status and the difference between Full Code and Do Not Resuscitate. Shelly Parks shares she and Shelly Parks have discussed this in the past and wishes are to remain Full Code.  Reviewed documents in [place], Shelly Parks has been appointed HCPOA and her daughter-Shelly Parks is second HCPOA. Reviewed Living Will document.  Shelly Parks is hopeful Shelly Parks will continue to recover. She expresses her interest in STR for Shelly Parks. Briefly discussed process of STR admittance, reviewed PT notes-recommending STR, will ensure TOC is engaged early next week.  We discussed the role of outpatient palliative, Shelly Parks would like to consider this prior to referral being sent.  I discussed importance of continued conversations with family/support persons and all members of their medical team regarding overall plan of care and treatment options ensuring decisions are in alignment  with patients goals of care.  All questions/concerns addressed. Emotional support provided to patient/family/support persons. PMT will continue to follow and support patient as needed.  Objective: Primary  Diagnoses: Present on Admission:  UTI (urinary tract infection)   Physical Exam Constitutional:      General: She is not in acute distress.    Appearance: She is ill-appearing.  Pulmonary:     Effort: Pulmonary effort is normal. No respiratory distress.  Skin:    General: Skin is warm and dry.  Neurological:     Mental Status: She is alert. She is disoriented.     Motor: Weakness present.     Comments: Oriented to person     Vital Signs: BP (!) 114/55 (BP Location: Right Arm)   Pulse 99   Temp 97.9 F (36.6 C)   Resp 18   Ht 5' 3 (1.6 m)   Wt 53.5 kg   SpO2 98%   BMI 20.90 kg/m  Pain Scale: 0-10   Pain Score: 0-No pain  IO: Intake/output summary:  Intake/Output Summary (Last 24 hours) at 01/17/2024 1514 Last data filed at 01/17/2024 1420 Gross per 24 hour  Intake 480 ml  Output 1450 ml  Net -970 ml    LBM: Last BM Date : 01/15/24 Baseline Weight: Weight: 53.5 kg Most recent weight: Weight: 53.5 kg      Assessment and Plan  SUMMARY OF RECOMMENDATIONS   Full Code/Full Scope PMT to continue to follow for ongoing needs and support  Palliative Prophylaxis:   Bowel Regimen, Delirium Protocol and Frequent Pain Assessment  Thank you for this consult and allowing Palliative Medicine to participate in the care of Shelly Parks. Palliative medicine will continue to follow and assist as needed.   Time Total: 75 minutes  Time spent includes: Detailed review of medical records (labs, imaging, vital signs), medically appropriate exam (mental status, respiratory, cardiac, skin), discussed with treatment team, counseling and educating patient, family and staff, documenting clinical information, medication management and coordination of care.   Signed by: Waddell Lesches, DNP, AGNP-C Palliative Medicine    Please contact Palliative Medicine Team phone at [phone] for questions and concerns.  For individual provider: See Tracey

## 2024-01-17 NOTE — Progress Notes (Signed)
 Progress Note   Patient: Shelly Parks FMW:[id number] DOB: May 03, 1941 DOA: 01/14/2024     0 DOS: the patient was seen and examined on 01/17/2024   Brief hospital course:  Shelly Parks is a 83 y.o. female with past medical history of HTN, HLD, prediabetic, COPD, depression, dementia as reviewed from EMR, patient is very poor historian, AAO x 1.  EMR reviewed, got information from patient's daughter at bedside and from ED attending.  As per patient's daughter she is feeling generalized weakness for past few days to the point that she is unable to ambulate.  No new specific complaints. On further questioning she did mention polyuria and polydipsia, ED workup showed hyperglycemia.  My suspicion is that she may be having all the symptoms due to dehydration and hyperglycemia.   ED Course: VS afebrile, HR 98, RR 16, BP 116/61, 100% on room air BMP: Na 130, K3.3, blood glucose 306 elevated BUN 31 CBC: Hb 10.8, rest within normal range CT head: Negative for any acute stroke.  Chronic ischemic microvascular disease.     Assessment and Plan:  Principal Problem:   Weakness generalized     # Hyperglycemia due to diabetes mellitus, new onset History of prediabetes Presented with generalized weakness, polyuria and polydipsia. Serum glucose 306 Continue IV fluid for hydration Hemoglobin A1c 6.2 Continue NovoLog  sliding scale Maintain consistent carbohydrate diet   # Urinary tract infection Patient with urinary incontinence and foul-smelling urine UA shows pyuria and urine culture growing >100K multiple species, suggested recollection. S/p Ceftriaxone  DC'd on 7/13, started Cefepime  due to leukocytosis 7/13 WBC 21.2 elevated 7/12 urinary retention, Foley catheter was inserted and 1220 mL urine was collected, it was dark color possible hematuria 7/13 follow repeat blood cultures   # Acute metabolic encephalopathy Secondary to UTI, hyperglycemia and electrolyte abnormalities Patient at baseline  has dementia but according to her daughter and caregiver she is more confused and has difficulty ambulating. At baseline she does not require an assistive device Expect improvement in patient's mental status following resolution of acute illness     # Hyponatremia  Most likely due to hyperglycemia Improved with IV fluid hydration but not back to baseline Continue IV fluid Check BMP daily   # Hypokalemia Secondary to diuretic therapy.  Patient was on hydrochlorothiazide  Potassium repleted. Magnesium  within normal range    # HTN, HLD, history of prior CVA No residual weakness Resumed aspirin  Continue amlodipine  Hold hydrochlorothiazide  and lisinopril  due to dehydration and electrolyte imbalance    # COPD, no exacerbation noticed on exam Home inhaler switch to Breo Ellipta  inhaler Continue albuterol  as needed     # Generalized weakness and difficulty ambulation due to above Continue fall precautions, turn patient every 2 hourly Ambulate with assistance Appreciate PT input, they recommend SNF on discharge     # Vitamin D  deficiency: started vitamin D  50,000 units p.o. weekly, follow with PCP to repeat vitamin D  level after 3 to 6 months.  # Iron  deficiency, transferrin 08% Patient has very poor oral intake and unable to swallow pills 7/12 Venofer  3 mg IV one-time dose given Patient will benefit from oral iron  supplement on discharge if oral intake improves  DVT Prophylaxis: Subcutaneous Lovenox     Subjective: No significant events overnight, patient is more awake and alert today.  Yesterday she was somnolent, found to have urinary retention after Foley catheter insertion patient's condition improved. Patient's daughter was at bedside, management plan discussed.   Physical Exam: Vitals:   01/16/24 2041  01/17/24 0535 01/17/24 0710 01/17/24 0800  BP: (!) 103/58 (!) 97/53  (!) 110/48  Pulse: (!) 103 95  100  Resp: 20 18  (!) 21  Temp: 97.8 F (36.6 C) (!) 97.5 F  (36.4 C)  97.7 F (36.5 C)  TempSrc:      SpO2: 97% 97% 96% 98%  Weight:      Height:       General: AAOx 1. Appear in no acute distress, affect appropriate, frail Eyes: PERRLA, Conjunctiva normal ENT: Oral Mucosa Clear, moist  Neck: no JVD Cardiovascular: S1 and S2 Present, no Murmur,  Respiratory: good respiratory effort, Bilateral Air entry equal Abdomen: Bowel Sound present, Soft and no tenderness,  Skin: no rashes  Extremities: no Pedal edema, no calf tenderness Neurologic: without any new focal findings Gait not checked due to patient safety concerns   Data Reviewed: K 3.1, creatinine 1.59, wbc 21.2, A1c 6.2,  Ucx growing multiple species, need to be recollected Labs reviewed  Family Communication: Plan of care discussed with patient's daughter at the bedside.  She verbalizes understanding and agrees with the plan.  Disposition: Status is: Inpatient The patient will require care spanning > 2 midnights and should be moved to inpatient because: IV antibiotics and UTI with leukocytosis  Planned Discharge Destination: Skilled nursing facility    Time spent:  55 minutes  Author: Elvan Sor, MD 01/17/2024 2:17 PM  For on call review [email or web address].

## 2024-01-18 DIAGNOSIS — Z87898 Personal history of other specified conditions: Secondary | ICD-10-CM | POA: Diagnosis not present

## 2024-01-18 DIAGNOSIS — N3000 Acute cystitis without hematuria: Secondary | ICD-10-CM | POA: Diagnosis not present

## 2024-01-18 DIAGNOSIS — R410 Disorientation, unspecified: Secondary | ICD-10-CM

## 2024-01-18 DIAGNOSIS — R339 Retention of urine, unspecified: Secondary | ICD-10-CM

## 2024-01-18 DIAGNOSIS — Z515 Encounter for palliative care: Secondary | ICD-10-CM | POA: Diagnosis not present

## 2024-01-18 DIAGNOSIS — R531 Weakness: Secondary | ICD-10-CM | POA: Diagnosis not present

## 2024-01-18 LAB — CBC
HCT: 27.6 % — ABNORMAL LOW (ref 36.0–46.0)
Hemoglobin: 9 g/dL — ABNORMAL LOW (ref 12.0–15.0)
MCH: 30 pg (ref 26.0–34.0)
MCHC: 32.6 g/dL (ref 30.0–36.0)
MCV: 92 fL (ref 80.0–100.0)
Platelets: 343 K/uL (ref 150–400)
RBC: 3 MIL/uL — ABNORMAL LOW (ref 3.87–5.11)
RDW: 13.2 % (ref 11.5–15.5)
WBC: 15.1 K/uL — ABNORMAL HIGH (ref 4.0–10.5)
nRBC: 0 % (ref 0.0–0.2)

## 2024-01-18 LAB — URINE CULTURE: Culture: NO GROWTH

## 2024-01-18 LAB — BASIC METABOLIC PANEL WITH GFR
Anion gap: 7 (ref 5–15)
BUN: 43 mg/dL — ABNORMAL HIGH (ref 8–23)
CO2: 23 mmol/L (ref 22–32)
Calcium: 8.9 mg/dL (ref 8.9–10.3)
Chloride: 112 mmol/L — ABNORMAL HIGH (ref 98–111)
Creatinine, Ser: 0.88 mg/dL (ref 0.44–1.00)
GFR, Estimated: >60 mL/min (ref >60)
Glucose, Bld: 97 mg/dL (ref 70–99)
Potassium: 4 mmol/L (ref 3.5–5.1)
Sodium: 142 mmol/L (ref 135–145)

## 2024-01-18 LAB — GLUCOSE, CAPILLARY
Glucose-Capillary: 95 mg/dL (ref 70–99)
Glucose-Capillary: 159 mg/dL — ABNORMAL HIGH (ref 70–99)
Glucose-Capillary: 177 mg/dL — ABNORMAL HIGH (ref 70–99)
Glucose-Capillary: 125 mg/dL — ABNORMAL HIGH (ref 70–99)

## 2024-01-18 LAB — PHOSPHORUS: Phosphorus: 1.6 mg/dL — ABNORMAL LOW (ref 2.5–4.6)

## 2024-01-18 LAB — MAGNESIUM: Magnesium: 2.1 mg/dL (ref 1.7–2.4)

## 2024-01-18 MED ORDER — SODIUM CHLORIDE 0.9 % IR SOLN: 3000.0000 mL | Status: DC

## 2024-01-18 MED ORDER — SODIUM CHLORIDE 0.9 % IV SOLN: INTRAVENOUS | Status: AC

## 2024-01-18 MED ORDER — POTASSIUM PHOSPHATES 15 MMOLE/5ML IV SOLN
30.0000 mmol | Freq: Once | INTRAVENOUS | Status: AC
  Administered 2024-01-18: 30 mmol via INTRAVENOUS
  Filled 2024-01-18: qty 10

## 2024-01-18 MED ORDER — ENSURE PLUS HIGH PROTEIN PO LIQD
237.0000 mL | Freq: Three times a day (TID) | ORAL | Status: DC
  Administered 2024-01-18 – 2024-01-21 (×8): 237 mL via ORAL

## 2024-01-18 MED ORDER — ENOXAPARIN SODIUM 40 MG/0.4ML IJ SOSY
40.0000 mg | PREFILLED_SYRINGE | INTRAMUSCULAR | Status: DC
  Administered 2024-01-18 – 2024-01-20 (×3): 40 mg via SUBCUTANEOUS
  Filled 2024-01-18 (×3): qty 0.4

## 2024-01-18 NOTE — TOC Initial Note (Signed)
 Transition of Care [facility]) - Initial/Assessment Note    Patient Details  Name: Shelly Parks MRN: [id number] Date of Birth: 24-Aug-1940  [facility] CM/SW Contact:    Alvaro Louder, LCSW Phone Number: 01/18/2024, 11:13 AM  Clinical Narrative:    LCSWA met with patient and niece Olam at bedside. Patient did not speak and niece spoke on behalf. Olam indicated that the patient lived with her daughter Amy prior to discharge. She indicated that the patient was able to dress herself with help. She indicated that the patient was able to ambulate without any DME. Olam reported that the patient did not have any assistive devices at home. Olam indicated that her daughter and she will help the patient at discharge. Olam reported that her medication was affordable and her daughter takes her to appointments.   Olam alluded to contacting the patients daughter Amy about possible SNF placement for the patient. LCSWA called and spoke to daughter Amy to confirm the plan for SNF placement at discharge. Amy indicated that she wants to continue the plan of SNF. She indicated that she prefers [facility] as a destination due to knowing people at the facility.   LCSWA to complete SNF work-up and fax out information.     TOC to follow for discharge       Expected Discharge Plan: Skilled Nursing Facility Barriers to Discharge: Continued Medical Work up, English as a second language teacher   Patient Goals and CMS Choice Patient states their goals for this hospitalization and ongoing recovery are:: UTA          Expected Discharge Plan and Services   Discharge Planning Services: CM Consult Post Acute Care Choice: Skilled Nursing Facility Living arrangements for the past 2 months: Single Family Home                                      Prior Living Arrangements/Services Living arrangements for the past 2 months: Single Family Home Lives with:: Adult Children Patient language and need for  interpreter reviewed:: Yes Do you feel safe going back to the place where you live?: Yes      Need for Family Participation in Patient Care: Yes (Comment) Care giver support system in place?: Yes (comment)   Criminal Activity/Legal Involvement Pertinent to Current Situation/Hospitalization: No - Comment as needed  Activities of Daily Living      Permission Sought/Granted Permission sought to share information with : Case Manager                Emotional Assessment Appearance:: Appears stated age Attitude/Demeanor/Rapport: Engaged Affect (typically observed): Appropriate Orientation: : Oriented to Self, Oriented to Situation      Admission diagnosis:  Acute confusion [R41.0] Weakness generalized [R53.1] Acute weakness [R53.1] UTI (urinary tract infection) [N39.0] Patient Active Problem List   Diagnosis Date Noted   UTI (urinary tract infection) 01/17/2024   Weakness generalized 01/14/2024   Acute stroke due to ischemia (HCC) 03/11/2022   Acute metabolic encephalopathy 03/11/2022   Type 2 diabetes mellitus with hyperlipidemia (HCC) 03/11/2022   Acute CVA (cerebrovascular accident) (HCC) 03/11/2022   GERD (gastroesophageal reflux disease) 03/11/2022   Acute encephalopathy 03/10/2022   Hypokalemia 03/10/2022   Memory changes 03/10/2022   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   HLD (hyperlipidemia)    Essential hypertension    Anemia    Major depressive disorder, single episode, unspecified    Reflux  COPD (chronic obstructive pulmonary disease) (HCC)    Incontinence of urine in female    Diabetes mellitus without complication (HCC)    PCP:  Gerome Tillman CROME, FNP Pharmacy:   [facility] [phone] [address], [place] - [address] [place] [address] Phone: [phone] Fax: [phone]     Social Drivers of Health (SDOH) Social History: SDOH Screenings   Tobacco Use: Medium Risk (01/14/2024)   SDOH Interventions:     Readmission Risk  Interventions     No data to display

## 2024-01-18 NOTE — Progress Notes (Signed)
 Provider notified of change in bloody urine concentration. Provider gave verbal order to start CBI until urin runs clear and to pass it on dayshift regarding holding or d/c lovenox .   Provider asked this RN to place CBI order in the EMR per verbal order.

## 2024-01-18 NOTE — Consult Note (Signed)
 Brief consult note  Patient seen and examined.  Chart reviewed.  Hematuria most likely secondary to relief of bladder overdistention.  Since she had >1 L in her bladder recommend indwelling Foley x 10 days followed by voiding trial.  Will schedule office cystoscopy once her catheter has been removed.  Full consult to follow

## 2024-01-18 NOTE — Progress Notes (Signed)
 SLP Cancellation Note  Patient Details Name: Shelly Parks MRN: [id number] DOB: 15-Dec-1940   Cancelled treatment:       Reason Eval/Treat Not Completed: Other (comment). Per chart review, pt with increased alertness/engagement. Daughter reported that pt tolerated lunch meal well on 7/13. RN denied issue with medication administration crushed in puree. As mentation continues to improve recommend advancement of solids as indicated. No further acute SLP services indicated.  [place] Nikesh Teschner Clapp, MS, CCC-SLP Speech Language Pathologist Rehab Services; [facility] [phone] (ascom)    [place] J Clapp 01/18/2024, 12:50 PM

## 2024-01-18 NOTE — Progress Notes (Signed)
                                                                                                                                                                                                           Daily Progress Note   Patient Name: Shelly Parks      Date: 01/18/2024 DOB: [birth year]  Age: 83 y.o. MRN#: [id number] Attending Physician: Von Bellis, MD Primary Care Physician: Gerome Tillman CROME, FNP Admit Date: 01/14/2024  Reason for Consultation/Follow-up: Establishing goals of care  HPI/Brief Hospital Review: 83 y.o. female  with past medical history of hypertension, hyperlipidemia, prediabetes, depression, dementia admitted from home on 01/14/2024 with generalized weakness as well as polyuria and polydipsia.   In the ED glucose 306, sodium 138 potassium 3.3 CT negative for acute processes but did reveal chronic ischemic microvascular disease   Admitted and being treated for UTI, acute metabolic encephalopathy and new onset diabetes   Palliative medicine was consulted for assisting with goals of care conversation  Subjective: Extensive chart review has been completed prior to meeting patient including labs, vital signs, imaging, progress notes, orders, and available advanced directive documents from current and previous encounters.    Visited with Shelly Parks at her bedside. She is finishing up with therapy, she was able to walk around the unit without difficulty. Shelly Parks shares she is feeling much better today. She denies acute pain or discomfort, resting well at night and appetite seems to be slowly improving. Shelly Parks at bedside during time of visit.  According to [facility] notes, process for STR at discharge has started.  PMT will step away from daily visits as goals and plans are clear, please reengage if needs or concerns arise.  Thank you for allowing the Palliative Medicine Team to assist in the care of this patient.  Total time:  25 minutes  Time spent includes: Detailed review of  medical records (labs, imaging, vital signs), medically appropriate exam (mental status, respiratory, cardiac, skin), discussed with treatment team, counseling and educating patient, family and staff, documenting clinical information, medication management and coordination of care.  Waddell Lesches, DNP, AGNP-C Palliative Medicine   Please contact Palliative Medicine Team phone at [phone] for questions and concerns.

## 2024-01-18 NOTE — Plan of Care (Signed)
 CBI initiation pending awaiting supply to be delivered to bedside   Problem: Education: Goal: Ability to describe self-care measures that may prevent or decrease complications (Diabetes Survival Skills Education) will improve Outcome: Progressing   Problem: Coping: Goal: Ability to adjust to condition or change in health will improve Outcome: Progressing   Problem: Fluid Volume: Goal: Ability to maintain a balanced intake and output will improve Outcome: Progressing   Problem: Skin Integrity: Goal: Risk for impaired skin integrity will decrease Outcome: Progressing   Problem: Activity: Goal: Risk for activity intolerance will decrease Outcome: Progressing   Problem: Pain Managment: Goal: General experience of comfort will improve and/or be controlled Outcome: Progressing

## 2024-01-18 NOTE — Consult Note (Incomplete)
 Urology Consult  Requesting physician: Elvan Sor, MD  Reason for consultation: Urinary retention with hematuria   Assessment/Recommendations:  1.  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  Gross hematuria Resolved Significant pyuria with urine culture growing multiple species.  Potential etiologies include chronic inflammation exacerbated by Foley placement and hematuria secondary to decompression of a chronically over distended bladder Will schedule outpatient cystoscopy after voiding trial   Chief Complaint: N/A   History of Present Illness: Shelly Parks is a 83 y.o. female admitted 01/14/2024 with generalized weakness and mental status changes.  She was noted to have significant pyuria and foul-smelling urine.  Foley catheter was placed 01/16/2024 with return of 1220 mL.  Urine culture subsequently grew 100,000 colonies of mixed flora.  She developed gross hematuria on 7/13.  Her daughter was with her who contributed to the history.  History of bladder surgery late 1960 though exact nature of the procedure none  Past Medical History:  Diagnosis Date   Anemia    Asthma    COPD (chronic obstructive pulmonary disease) (HCC)    Diabetes mellitus without complication (HCC)    HLD (hyperlipidemia)    HTN (hypertension), benign    Incontinence of urine in female    Major depressive disorder, single episode, unspecified    Reflux     Past Surgical History:  Procedure Laterality Date   BLADDER SURGERY  1969   BREAST BIOPSY Right    long ago pt ? date   BREAST SURGERY  1973    Home Medications:  Current Meds  Medication Sig   amLODipine  (NORVASC ) 10 MG tablet Take 10 mg by mouth daily.   aspirin  EC 81 MG tablet Take 1 tablet (81 mg total) by mouth daily. Swallow whole.   fluticasone -salmeterol (WIXELA INHUB) 100-50 MCG/ACT AEPB Inhale 1 puff  into the lungs.  Inhale 1 inhalation into the lungs every 12 (twelve) hours   hydrochlorothiazide  (HYDRODIURIL ) 25 MG tablet Take 25 mg by mouth every morning.   lisinopril  (PRINIVIL ,ZESTRIL ) 40 MG tablet Take 40 mg by mouth daily.   [DISCONTINUED] Fluticasone -Salmeterol (ADVAIR DISKUS) 100-50 MCG/DOSE AEPB Inhale 1 puff into the lungs 2 (two) times daily.    Allergies: No Known Allergies  Family History  Problem Relation Age of Onset   Cancer Father    Hypertension Mother    Cancer Brother    Hypertension Sister    Bladder Cancer Sister     Social History:  reports that she quit smoking about 53 years ago. She has never used smokeless tobacco. She reports that she does not drink alcohol and does not use drugs.  ROS: Unable to obtain  Physical Exam:  Vital signs in last 24 hours: Temp:  [97.4 F (36.3 C)-98.3 F (36.8 C)] 97.8 F (36.6 C) (07/14 0814) Pulse Rate:  [80-82] 82 (07/14 0814) Resp:  [16-22] 16 (07/14 0814) BP: (99-112)/(45-55) 112/50 (07/14 0814) SpO2:  [96 %-100 %] 100 % (07/14 0814) Constitutional:  Alert, No acute distress HEENT: [place] AT, moist mucus membranes.  Trachea midline, no masses Respiratory: Normal respiratory effort GI: Abdomen is soft, nontender, nondistended, no abdominal masses GU: No CVA tenderness.  Foley catheter draining clear urine   Laboratory Data:  Recent Labs    01/16/24 0639 01/17/24 0347 01/18/24 0544  WBC 18.5* 21.2* 15.1*  HGB 11.2* 10.4* 9.0*  HCT 32.7* 30.1* 27.6*  Recent Labs    01/16/24 0639 01/17/24 0347 01/18/24 0544  NA 136 138 142  K 3.2* 3.1* 4.0  CL 105 108 112*  CO2 21* 20* 23  GLUCOSE 203* 172* 97  BUN 43* 52* 43*  CREATININE 1.20* 1.59* 0.88  CALCIUM  9.2 8.7* 8.9   No results for input(s): LABPT, INR in the last 72 hours. No results for input(s): LABURIN in the last 72 hours. Results for orders placed or performed during the hospital encounter of 01/14/24  Urine Culture     Status: Abnormal    Collection Time: 01/14/24  5:44 PM   Specimen: Urine, Random  Result Value Ref Range Status   Specimen Description   Final    URINE, RANDOM Performed at [facility], [address], [place], [place] [address]    Special Requests   Final    NONE Reflexed from [phone] Performed at [facility], [address] [place], [place] [address]    Culture (A)  Final    >=100,000 COLONIES/mL MULTIPLE SPECIES PRESENT, SUGGEST RECOLLECTION   Report Status 01/16/2024 FINAL  Final  Resp panel by RT-PCR (RSV, Flu A&B, Covid) Anterior Nasal Swab     Status: None   Collection Time: 01/16/24  5:46 PM   Specimen: Anterior Nasal Swab  Result Value Ref Range Status   SARS Coronavirus 2 by RT PCR NEGATIVE NEGATIVE Final    Comment: (NOTE) SARS-CoV-2 target nucleic acids are NOT DETECTED.  The SARS-CoV-2 RNA is generally detectable in upper respiratory specimens during the acute phase of infection. The lowest concentration of SARS-CoV-2 viral copies this assay can detect is 138 copies/mL. A negative result does not preclude SARS-Cov-2 infection and should not be used as the sole basis for treatment or other patient management decisions. A negative result may occur with  improper specimen collection/handling, submission of specimen other than nasopharyngeal swab, presence of viral mutation(s) within the areas targeted by this assay, and inadequate number of viral copies(<138 copies/mL). A negative result must be combined with clinical observations, patient history, and epidemiological information. The expected result is Negative.  Fact Sheet for Patients:  BloggerCourse.com  Fact Sheet for Healthcare Providers:  SeriousBroker.it  This test is no t yet approved or cleared by the [place]  FDA and  has been authorized for detection and/or diagnosis of SARS-CoV-2 by FDA under an Emergency Use Authorization (EUA). This EUA will  remain  in effect (meaning this test can be used) for the duration of the COVID-19 declaration under Section 564(b)(1) of the Act, 21 U.S.C.section 360bbb-3(b)(1), unless the authorization is terminated  or revoked sooner.       Influenza A by PCR NEGATIVE NEGATIVE Final   Influenza B by PCR NEGATIVE NEGATIVE Final    Comment: (NOTE) The Xpert Xpress SARS-CoV-2/FLU/RSV plus assay is intended as an aid in the diagnosis of influenza from Nasopharyngeal swab specimens and should not be used as a sole basis for treatment. Nasal washings and aspirates are unacceptable for Xpert Xpress SARS-CoV-2/FLU/RSV testing.  Fact Sheet for Patients: BloggerCourse.com  Fact Sheet for Healthcare Providers: SeriousBroker.it  This test is not yet approved or cleared by the [place]  FDA and has been authorized for detection and/or diagnosis of SARS-CoV-2 by FDA under an Emergency Use Authorization (EUA). This EUA will remain in effect (meaning this test can be used) for the duration of the COVID-19 declaration under Section 564(b)(1) of the Act, 21 U.S.C. section 360bbb-3(b)(1), unless the authorization is terminated or revoked.  Resp Syncytial Virus by PCR NEGATIVE NEGATIVE Final    Comment: (NOTE) Fact Sheet for Patients: BloggerCourse.com  Fact Sheet for Healthcare Providers: SeriousBroker.it  This test is not yet approved or cleared by the [place]  FDA and has been authorized for detection and/or diagnosis of SARS-CoV-2 by FDA under an Emergency Use Authorization (EUA). This EUA will remain in effect (meaning this test can be used) for the duration of the COVID-19 declaration under Section 564(b)(1) of the Act, 21 U.S.C. section 360bbb-3(b)(1), unless the authorization is terminated or revoked.  Performed at [facility], [address] [place], [place] [address]    Respiratory (~20 pathogens) panel by PCR     Status: None   Collection Time: 01/16/24  5:46 PM   Specimen: Nasopharyngeal Swab; Respiratory  Result Value Ref Range Status   Adenovirus NOT DETECTED NOT DETECTED Final   Coronavirus 229E NOT DETECTED NOT DETECTED Final    Comment: (NOTE) The Coronavirus on the Respiratory Panel, DOES NOT test for the novel  Coronavirus (2019 nCoV)    Coronavirus HKU1 NOT DETECTED NOT DETECTED Final   Coronavirus NL63 NOT DETECTED NOT DETECTED Final   Coronavirus OC43 NOT DETECTED NOT DETECTED Final   Metapneumovirus NOT DETECTED NOT DETECTED Final   Rhinovirus / Enterovirus NOT DETECTED NOT DETECTED Final   Influenza A NOT DETECTED NOT DETECTED Final   Influenza B NOT DETECTED NOT DETECTED Final   Parainfluenza Virus 1 NOT DETECTED NOT DETECTED Final   Parainfluenza Virus 2 NOT DETECTED NOT DETECTED Final   Parainfluenza Virus 3 NOT DETECTED NOT DETECTED Final   Parainfluenza Virus 4 NOT DETECTED NOT DETECTED Final   Respiratory Syncytial Virus NOT DETECTED NOT DETECTED Final   Bordetella pertussis NOT DETECTED NOT DETECTED Final   Bordetella Parapertussis NOT DETECTED NOT DETECTED Final   Chlamydophila pneumoniae NOT DETECTED NOT DETECTED Final   Mycoplasma pneumoniae NOT DETECTED NOT DETECTED Final    Comment: Performed at [facility], [address] [place], [place] [address]  Urine Culture (for pregnant, neutropenic or urologic patients or patients with an indwelling urinary catheter)     Status: None   Collection Time: 01/17/24 10:30 AM   Specimen: Urine, Catheterized  Result Value Ref Range Status   Specimen Description   Final    URINE, CATHETERIZED Performed at [facility], [address], [place], [place] [address]    Special Requests   Final    NONE Performed at [facility], [address], [place], [place] [address]    Culture   Final    NO GROWTH Performed at [facility], [address],  [place], [place] [address]    Report Status 01/18/2024 FINAL  Final  Culture, blood (Routine X 2) w Reflex to ID Panel     Status: None (Preliminary result)   Collection Time: 01/17/24 12:21 PM   Specimen: BLOOD  Result Value Ref Range Status   Specimen Description   Final    BLOOD LEFT ANTECUBITAL Performed at [facility], [address], [place], [place] [address]    Special Requests   Final    BOTTLES DRAWN AEROBIC AND ANAEROBIC Blood Culture adequate volume Performed at [facility], [address] [place], [place] [address]    Culture   Final    NO GROWTH 1 DAY Performed at [facility], [address] [place], [place] [address]    Report Status PENDING  Incomplete  Culture, blood (  Routine X 2) w Reflex to ID Panel     Status: None (Preliminary result)   Collection Time: 01/17/24 12:32 PM   Specimen: BLOOD LEFT HAND  Result Value Ref Range Status   Specimen Description   Final    BLOOD LEFT HAND Performed at [facility], [address] [place], [place] [address]    Special Requests   Final    BOTTLES DRAWN AEROBIC AND ANAEROBIC Blood Culture adequate volume Performed at [facility], [address], [place], [place] [address]    Culture   Final    NO GROWTH 1 DAY Performed at [facility], [address] [place], [place] [address]    Report Status PENDING  Incomplete      01/18/2024, 4:09 PM  Glendia Barba,  MD

## 2024-01-18 NOTE — Progress Notes (Signed)
 Occupational Therapy Treatment Patient Details Name: Shelly Parks MRN: [id number] DOB: 10/28/1940 Today's Date: 01/18/2024   History of present illness 83 y.o. female with Past medical history of HTN, HLD, prediabetic, COPD, depression, and possible dementia.  Per daughter pt was feeling generalized weakness, polyuria, and polydipsia for past few days to the point that she is unable to ambulate.   OT comments  Pt seen for OT tx. Confirmed with RN prior to session that CBI had not been initiated yet. Pt pleasant, agreeable to session, and more conversational responding appropriately to simple commands and simple questions. Niece present and supportive throughout. Pt required MIN A and VC for sequencing for bed mobility. Once EOB, demo's good sitting balance. Set up and VC to initiate grooming tasks and able to complete the task without additional cues. Pt stood with CGA with RW, and ultimately ambulated ~100' with RW requiring CGA, VC for turns and finding room, and occasional manual assist to adjust RW positioning to optimize safety and posture. Pt denied complaints throughout. Noted urine output into foley bag was red. NP notified at end of session. Pt demonstrating good progress towards goals.       If plan is discharge home, recommend the following:  A little help with walking and/or transfers;Direct supervision/assist for medications management;Supervision due to cognitive status;Direct supervision/assist for financial management;Assist for transportation;Assistance with cooking/housework;Help with stairs or ramp for entrance;A little help with bathing/dressing/bathroom   Equipment Recommendations  Other (comment) (defer)    Recommendations for Other Services      Precautions / Restrictions Precautions Precautions: Fall Recall of Precautions/Restrictions: Impaired Restrictions Weight Bearing Restrictions Per Provider Order: No       Mobility Bed Mobility Overal bed mobility: Needs  Assistance Bed Mobility: Supine to Sit, Sit to Supine     Supine to sit: Min assist, HOB elevated, Used rails Sit to supine: Min assist   General bed mobility comments: VC for initiated and MIN A to shift hips/trunk to EOB and for BLE mgt back into bed    Transfers Overall transfer level: Needs assistance Equipment used: Rolling walker (2 wheels) Transfers: Sit to/from Stand Sit to Stand: Contact guard assist, Min assist                 Balance Overall balance assessment: Needs assistance Sitting-balance support: Feet supported, Single extremity supported, No upper extremity supported Sitting balance-Leahy Scale: Good     Standing balance support: Bilateral upper extremity supported, Reliant on assistive device for balance Standing balance-Leahy Scale: Fair                             ADL either performed or assessed with clinical judgement   ADL Overall ADL's : Needs assistance/impaired     Grooming: Sitting;Set up;Wash/dry face Grooming Details (indicate cue type and reason): 1 verbal cue paired with visual cue after set up and pt able to complete the task.                                    Extremity/Trunk Assessment              Vision       Haematologist Communication Communication: Impaired Factors Affecting Communication: Hearing impaired   Cognition Arousal: Alert Behavior During Therapy: WFL for tasks assessed/performed Cognition: History of  cognitive impairments             OT - Cognition Comments: Pt pleasant, agreeable, much more appropriate as compared to evaluation                 Following commands: Impaired Following commands impaired: Follows one step commands with increased time, Follows multi-step commands inconsistently      Cueing   Cueing Techniques: Verbal cues, Tactile cues, Visual cues  Exercises Other Exercises Other Exercises: Pt able to follow simple  commands during ADL mobility with RW ~100' occasionally requiring manual assist to adjust RW to improve positioning and VC for turning/finding room    Shoulder Instructions       General Comments      Pertinent Vitals/ Pain       Pain Assessment Pain Assessment: No/denies pain  Home Living                                          Prior Functioning/Environment              Frequency  Min 2X/week        Progress Toward Goals  OT Goals(current goals can now be found in the care plan section)  Progress towards OT goals: Progressing toward goals  Acute Rehab OT Goals Patient Stated Goal: dtr wants her to get better OT Goal Formulation: With patient/family Time For Goal Achievement: 01/29/24 Potential to Achieve Goals: Good  Plan      Co-evaluation                 AM-PAC OT 6 Clicks Daily Activity     Outcome Measure   Help from another person eating meals?: A Little Help from another person taking care of personal grooming?: A Little Help from another person toileting, which includes using toliet, bedpan, or urinal?: A Little Help from another person bathing (including washing, rinsing, drying)?: A Lot Help from another person to put on and taking off regular upper body clothing?: A Little Help from another person to put on and taking off regular lower body clothing?: A Lot 6 Click Score: 16    End of Session Equipment Utilized During Treatment: Gait belt;Rolling walker (2 wheels)  OT Visit Diagnosis: Other abnormalities of gait and mobility (R26.89);Muscle weakness (generalized) (M62.81);Other symptoms and signs involving cognitive function   Activity Tolerance Patient tolerated treatment well   Patient Left in bed;with call bell/phone within reach;with bed alarm set;with family/visitor present   Nurse Communication          Time: 9044-8981 OT Time Calculation (min): 23 min  Charges: OT General Charges $OT Visit: 1  Visit OT Treatments $Self Care/Home Management : 8-22 mins $Therapeutic Activity: 8-22 mins  Warren SAUNDERS., MPH, MS, OTR/L ascom [phone] 01/18/24, 10:34 AM

## 2024-01-18 NOTE — TOC Progression Note (Addendum)
 Transition of Care [facility]) - Progression Note    Patient Details  Name: Shelly Parks MRN: [id number] Date of Birth: September 23, 1940  [facility] CM/SW Contact  Alvaro Louder, [place] Phone Number: 01/18/2024, 2:14 PM  Clinical Narrative:  LCSWA confirmed bed availability with [facility]. LCSWA informed patients daughter Amy of bed availability at SNF, Amy was agreeable. LCSWA contacted CMA to start insurance Auth. Insurance Auth currently pending.  TOC to follow for discharge     Expected Discharge Plan: Skilled Nursing Facility Barriers to Discharge: Continued Medical Work up, English as a second language teacher  Expected Discharge Plan and Services   Discharge Planning Services: CM Consult Post Acute Care Choice: Skilled Nursing Facility Living arrangements for the past 2 months: Single Family Home                                       Social Determinants of Health (SDOH) Interventions SDOH Screenings   Tobacco Use: Medium Risk (01/14/2024)    Readmission Risk Interventions     No data to display

## 2024-01-18 NOTE — Progress Notes (Signed)
 PT Cancellation Note  Patient Details Name: Shelly Parks MRN: [id number] DOB: Nov 24, 1940   Cancelled Treatment:     Pt politely declined this pm, mobilized well with OT earlier. Plan to continue PT in am, family to be present.   [name] 01/18/2024, 5:43 PM

## 2024-01-18 NOTE — NC FL2 (Signed)
 [place]  MEDICAID FL2 LEVEL OF CARE FORM     IDENTIFICATION  Patient Name: Shelly Parks Birthdate: Aug 17, 1940 Sex: female Admission Date (Current Location): 01/14/2024  [place] and [place] Number:  [place] [id number] K Facility and Address:  [facility], [address], [place], [place] [address]      Provider Number: [id number]  Attending Physician Name and Address:  Von Bellis, MD  Relative Name and Phone Number:  Greig [phone]    Current Level of Care: Hospital Recommended Level of Care: Skilled Nursing Facility Prior Approval Number:    Date Approved/Denied:   PASRR Number: [id number]  Discharge Plan: SNF    Current Diagnoses: Patient Active Problem List   Diagnosis Date Noted   UTI (urinary tract infection) 01/17/2024   Weakness generalized 01/14/2024   Acute stroke due to ischemia (HCC) 03/11/2022   Acute metabolic encephalopathy 03/11/2022   Type 2 diabetes mellitus with hyperlipidemia (HCC) 03/11/2022   Acute CVA (cerebrovascular accident) (HCC) 03/11/2022   GERD (gastroesophageal reflux disease) 03/11/2022   Acute encephalopathy 03/10/2022   Hypokalemia 03/10/2022   Memory changes 03/10/2022   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   HLD (hyperlipidemia)    Essential hypertension    Anemia    Major depressive disorder, single episode, unspecified    Reflux    COPD (chronic obstructive pulmonary disease) (HCC)    Incontinence of urine in female    Diabetes mellitus without complication (HCC)     Orientation RESPIRATION BLADDER Height & Weight     Self, Situation  Normal Continent Weight: 118 lb (53.5 kg) Height:  5' 3 (160 cm)  BEHAVIORAL SYMPTOMS/MOOD NEUROLOGICAL BOWEL NUTRITION STATUS      Incontinent Diet (DYS 1)  AMBULATORY STATUS COMMUNICATION OF NEEDS Skin   Limited Assist Verbally Normal                       Personal Care Assistance Level of Assistance  Bathing, Dressing, Feeding Bathing  Assistance: Limited assistance Feeding assistance: Limited assistance Dressing Assistance: Limited assistance     Functional Limitations Info  Speech     Speech Info: Impaired    SPECIAL CARE FACTORS FREQUENCY  PT (By licensed PT), OT (By licensed OT)     PT Frequency: 5x/week OT Frequency: 5x/week            Contractures      Additional Factors Info  Allergies, Code Status Code Status Info: Full Allergies Info: NKA           Current Medications (01/18/2024):  This is the current hospital active medication list Current Facility-Administered Medications  Medication Dose Route Frequency Provider Last Rate Last Admin   acetaminophen  (TYLENOL ) tablet 650 mg  650 mg Oral Q6H PRN Von Bellis, MD   650 mg at 01/16/24 1619   Or   acetaminophen  (TYLENOL ) suppository 650 mg  650 mg Rectal Q6H PRN Von Bellis, MD       amLODipine  (NORVASC ) tablet 10 mg  10 mg Oral Daily Von Bellis, MD   10 mg at 01/16/24 0834   arformoterol  (BROVANA ) nebulizer solution 15 mcg  15 mcg Nebulization BID Von Bellis, MD   15 mcg at 01/18/24 9282   ascorbic acid  (VITAMIN C ) tablet 500 mg  500 mg Oral Daily Von Bellis, MD   500 mg at 01/18/24 0906   aspirin  EC tablet 81 mg  81 mg Oral Daily Von Bellis, MD   81 mg at 01/18/24 0906   budesonide  (PULMICORT )  nebulizer solution 0.25 mg  0.25 mg Nebulization BID Von Bellis, MD   0.25 mg at 01/18/24 0715   ceFEPIme  (MAXIPIME ) 2 g in sodium chloride  0.9 % 100 mL IVPB  2 g Intravenous Q24H Nazari, Walid A, RPH 200 mL/hr at 01/17/24 1114 2 g at 01/17/24 1114   Chlorhexidine  Gluconate Cloth 2 % PADS 6 each  6 each Topical Daily Von Bellis, MD   6 each at 01/17/24 1420   enoxaparin  (LOVENOX ) injection 40 mg  40 mg Subcutaneous Q24H Elesa Perkins, [facility]       hydrALAZINE  (APRESOLINE ) injection 10 mg  10 mg Intravenous Q6H PRN Von Bellis, MD       insulin  aspart (novoLOG ) injection 0-9 Units  0-9 Units Subcutaneous TID WC Von Bellis, MD    1 Units at 01/17/24 1649   ipratropium-albuterol  (DUONEB) 0.5-2.5 (3) MG/3ML nebulizer solution 3 mL  3 mL Nebulization Q6H PRN Von Bellis, MD       ondansetron  (ZOFRAN ) tablet 4 mg  4 mg Oral Q6H PRN Von Bellis, MD       Or   ondansetron  (ZOFRAN ) injection 4 mg  4 mg Intravenous Q6H PRN Von Bellis, MD       pantoprazole  (PROTONIX ) EC tablet 40 mg  40 mg Oral Daily Von Bellis, MD   40 mg at 01/18/24 9093   potassium PHOSPHATE  30 mmol in dextrose 5 % 500 mL infusion  30 mmol Intravenous Once Von Bellis, MD 85 mL/hr at 01/18/24 0924 30 mmol at 01/18/24 0924   sodium chloride  flush (NS) 0.9 % injection 3 mL  3 mL Intravenous Q12H Von Bellis, MD   3 mL at 01/18/24 9072   sodium chloride  flush (NS) 0.9 % injection 3 mL  3 mL Intravenous Q12H Von Bellis, MD   3 mL at 01/18/24 9073   sodium chloride  flush (NS) 0.9 % injection 3 mL  3 mL Intravenous PRN Von Bellis, MD       sodium chloride  irrigation 0.9 % 3,000 mL  3,000 mL Irrigation Continuous Mansy, Jan A, MD       Vitamin D  (Ergocalciferol ) (DRISDOL ) 1.25 MG (50000 UNIT) capsule 50,000 Units  50,000 Units Oral Q7 days Von Bellis, MD         Discharge Medications: Please see discharge summary for a list of discharge medications.  Relevant Imaging Results:  Relevant Lab Results:   Additional Information SSN: [id number]  Alonte Wulff  [place], LCSW

## 2024-01-18 NOTE — Plan of Care (Signed)

## 2024-01-18 NOTE — Progress Notes (Signed)
 Progress Note   Patient: Shelly Parks FMW:[id number] DOB: Oct 06, 1940 DOA: 01/14/2024     1 DOS: the patient was seen and examined on 01/18/2024   Brief hospital course:  CHAELA BRANSCUM is a 83 y.o. female with past medical history of HTN, HLD, prediabetic, COPD, depression, dementia as reviewed from EMR, patient is very poor historian, AAO x 1.  EMR reviewed, got information from patient's daughter at bedside and from ED attending.  As per patient's daughter she is feeling generalized weakness for past few days to the point that she is unable to ambulate.  No new specific complaints. On further questioning she did mention polyuria and polydipsia, ED workup showed hyperglycemia.  My suspicion is that she may be having all the symptoms due to dehydration and hyperglycemia.   ED Course: VS afebrile, HR 98, RR 16, BP 116/61, 100% on room air BMP: Na 130, K3.3, blood glucose 306 elevated BUN 31 CBC: Hb 10.8, rest within normal range CT head: Negative for any acute stroke.  Chronic ischemic microvascular disease.     Assessment and Plan:  Principal Problem:   Weakness generalized     # Hyperglycemia due to diabetes mellitus, new onset History of prediabetes Presented with generalized weakness, polyuria and polydipsia. Serum glucose 306 Continue IV fluid for hydration Hemoglobin A1c 6.2 Continue NovoLog  sliding scale Maintain consistent carbohydrate diet   # Urinary tract infection Patient with urinary incontinence and foul-smelling urine UA shows pyuria and urine culture growing >100K multiple species, suggested recollection. S/p Ceftriaxone  DC'd on 7/13, started Cefepime  due to leukocytosis 7/13 WBC 21.2 elevated 7/12 urinary retention, Foley catheter was inserted and 1220 mL urine was collected, it was dark color possible hematuria 7/13 follow repeat blood cultures 7/14 urology consulted, recommended to continue Foley catheter for 10 days and then follow voiding trial.  Hematuria  most likely due to bladder overdistention.  # Acute metabolic encephalopathy Secondary to UTI, hyperglycemia and electrolyte abnormalities Patient at baseline has dementia but according to her daughter and caregiver she is more confused and has difficulty ambulating. At baseline she does not require an assistive device Expect improvement in patient's mental status following resolution of acute illness    # Hyponatremia  Most likely due to hyperglycemia Improved with IV fluid hydration but not back to baseline Continue IV fluid Check BMP daily   # Hypokalemia Secondary to diuretic therapy.  Patient was on hydrochlorothiazide  Potassium repleted. Magnesium  within normal range  # Hypophosphatemia due to nutritional deficiency.  Patient has very low appetite.  Ensure protein supplement started.  Phosphorus repleted. Monitor and replete as needed.    # HTN, HLD, history of prior CVA No residual weakness 7/14 hold Aspirin  due to hematuria 7/14 discontinued amlodipine  due to soft BP Hold hydrochlorothiazide  and lisinopril  due to dehydration and electrolyte imbalance Monitor BP and titrate medication accordingly   # COPD, no exacerbation noticed on exam Home inhaler switch to Breo Ellipta  inhaler Continue albuterol  as needed     # Generalized weakness and difficulty ambulation due to above Continue fall precautions, turn patient every 2 hourly Ambulate with assistance Appreciate PT input, they recommend SNF on discharge     # Vitamin D  deficiency: started vitamin D  50,000 units p.o. weekly, follow with PCP to repeat vitamin D  level after 3 to 6 months.  # Iron  deficiency, transferrin 08% Patient has very poor oral intake and unable to swallow pills 7/12 Venofer  3 mg IV one-time dose given Patient will benefit from oral  iron  supplement on discharge if oral intake improves  DVT Prophylaxis: Subcutaneous Lovenox     Subjective: No significant events overnight, patient is alert  and oriented, has low appetite but no any other complaints. Hematuria noticed, daughter was at bedside, management plan discussed.   Physical Exam: Vitals:   01/18/24 0715 01/18/24 0754 01/18/24 0814 01/18/24 1709  BP:  (!) 112/51 (!) 112/50 (!) 108/55  Pulse:  82 82 81  Resp:  17 16 19   Temp:  98.2 F (36.8 C) 97.8 F (36.6 C) 98.3 F (36.8 C)  TempSrc:  Oral    SpO2: 98% 100% 100% 100%  Weight:      Height:       General: AAOx 1. Appear in no acute distress, affect appropriate, frail Eyes: PERRLA, Conjunctiva normal ENT: Oral Mucosa Clear, moist  Neck: no JVD Cardiovascular: S1 and S2 Present, no Murmur,  Respiratory: good respiratory effort, Bilateral Air entry equal Abdomen: Bowel Sound present, Soft and no tenderness,  Skin: no rashes  Extremities: no Pedal edema, no calf tenderness Neurologic: without any new focal findings Gait not checked due to patient safety concerns   Data Reviewed: Labs reviewed  Family Communication: Plan of care discussed with patient's daughter at the bedside.  She verbalizes understanding and agrees with the plan.  Disposition: Status is: Inpatient The patient will require care spanning > 2 midnights and should be moved to inpatient because: IV antibiotics and UTI with leukocytosis  Planned Discharge Destination: Skilled nursing facility    Time spent:  55 minutes  Author: Elvan Sor, MD 01/18/2024 5:10 PM  For on call review [email or web address].

## 2024-01-19 DIAGNOSIS — R531 Weakness: Secondary | ICD-10-CM | POA: Diagnosis not present

## 2024-01-19 LAB — BASIC METABOLIC PANEL WITH GFR
Anion gap: 7 (ref 5–15)
BUN: 26 mg/dL — ABNORMAL HIGH (ref 8–23)
CO2: 22 mmol/L (ref 22–32)
Calcium: 8.8 mg/dL — ABNORMAL LOW (ref 8.9–10.3)
Chloride: 113 mmol/L — ABNORMAL HIGH (ref 98–111)
Creatinine, Ser: 0.72 mg/dL (ref 0.44–1.00)
GFR, Estimated: >60 mL/min (ref >60)
Glucose, Bld: 106 mg/dL — ABNORMAL HIGH (ref 70–99)
Potassium: 4.2 mmol/L (ref 3.5–5.1)
Sodium: 142 mmol/L (ref 135–145)

## 2024-01-19 LAB — CBC
HCT: 28.3 % — ABNORMAL LOW (ref 36.0–46.0)
Hemoglobin: 9.5 g/dL — ABNORMAL LOW (ref 12.0–15.0)
MCH: 30.4 pg (ref 26.0–34.0)
MCHC: 33.6 g/dL (ref 30.0–36.0)
MCV: 90.4 fL (ref 80.0–100.0)
Platelets: 376 K/uL (ref 150–400)
RBC: 3.13 MIL/uL — ABNORMAL LOW (ref 3.87–5.11)
RDW: 13.3 % (ref 11.5–15.5)
WBC: 9.6 K/uL (ref 4.0–10.5)
nRBC: 0 % (ref 0.0–0.2)

## 2024-01-19 LAB — MAGNESIUM: Magnesium: 1.7 mg/dL (ref 1.7–2.4)

## 2024-01-19 LAB — GLUCOSE, CAPILLARY: Glucose-Capillary: 98 mg/dL (ref 70–99)

## 2024-01-19 LAB — PHOSPHORUS: Phosphorus: 1.6 mg/dL — ABNORMAL LOW (ref 2.5–4.6)

## 2024-01-19 MED ORDER — SODIUM CHLORIDE 0.9 % IV SOLN: INTRAVENOUS | Status: AC

## 2024-01-19 MED ORDER — POTASSIUM PHOSPHATES 15 MMOLE/5ML IV SOLN
30.0000 mmol | Freq: Once | INTRAVENOUS | Status: AC
  Administered 2024-01-19: 30 mmol via INTRAVENOUS
  Filled 2024-01-19: qty 10

## 2024-01-19 NOTE — Plan of Care (Signed)
  Problem: Coping: Goal: Ability to adjust to condition or change in health will improve Outcome: Progressing   Problem: Fluid Volume: Goal: Ability to maintain a balanced intake and output will improve Outcome: Progressing   Problem: Health Behavior/Discharge Planning: Goal: Ability to identify and utilize available resources and services will improve Outcome: Progressing Goal: Ability to manage health-related needs will improve Outcome: Progressing   Problem: Metabolic: Goal: Ability to maintain appropriate glucose levels will improve Outcome: Progressing   Problem: Nutritional: Goal: Maintenance of adequate nutrition will improve Outcome: Progressing Goal: Progress toward achieving an optimal weight will improve Outcome: Progressing   Problem: Skin Integrity: Goal: Risk for impaired skin integrity will decrease Outcome: Progressing   Problem: Tissue Perfusion: Goal: Adequacy of tissue perfusion will improve Outcome: Progressing   Problem: Clinical Measurements: Goal: Ability to maintain clinical measurements within normal limits will improve Outcome: Progressing Goal: Will remain free from infection Outcome: Progressing Goal: Diagnostic test results will improve Outcome: Progressing Goal: Respiratory complications will improve Outcome: Progressing Goal: Cardiovascular complication will be avoided Outcome: Progressing   Problem: Activity: Goal: Risk for activity intolerance will decrease Outcome: Progressing   Problem: Nutrition: Goal: Adequate nutrition will be maintained Outcome: Progressing   Problem: Elimination: Goal: Will not experience complications related to bowel motility Outcome: Progressing Goal: Will not experience complications related to urinary retention Outcome: Progressing   Problem: Pain Managment: Goal: General experience of comfort will improve and/or be controlled Outcome: Progressing   Problem: Safety: Goal: Ability to remain  free from injury will improve Outcome: Progressing   Problem: Skin Integrity: Goal: Risk for impaired skin integrity will decrease Outcome: Progressing

## 2024-01-19 NOTE — Plan of Care (Signed)

## 2024-01-19 NOTE — TOC Progression Note (Signed)
 Transition of Care [facility]) - Progression Note    Patient Details  Name: Shelly Parks MRN: [id number] Date of Birth: 1940-10-16  [facility] CM/SW Contact  Alvaro Louder, [place] Phone Number: 01/19/2024, 11:05 AM  Clinical Narrative:   Insurance Auth approved: PlanAuthID: [id number]. LCSWA will continue to monitor patient until discharge.   TOC to follow for discharge   Expected Discharge Plan: Skilled Nursing Facility Barriers to Discharge: Continued Medical Work up, English as a second language teacher  Expected Discharge Plan and Services   Discharge Planning Services: CM Consult Post Acute Care Choice: Skilled Nursing Facility Living arrangements for the past 2 months: Single Family Home                                       Social Determinants of Health (SDOH) Interventions SDOH Screenings   Tobacco Use: Medium Risk (01/14/2024)    Readmission Risk Interventions     No data to display

## 2024-01-19 NOTE — Progress Notes (Signed)
 PROGRESS NOTE    MONYA KOZAKIEWICZ  FMW:[id number] DOB: 04-10-1941 DOA: 01/14/2024 PCP: Gerome Tillman CROME, FNP  [id number]/[id number]-AA  LOS: 2 days   Brief hospital course:   Assessment & Plan: Shelly Parks is a 83 y.o. female with past medical history of HTN, HLD, prediabetic, COPD, depression, dementia as reviewed from EMR, patient is very poor historian, AAO x 1.  EMR reviewed, got information from patient's daughter at bedside and from ED attending.  As per patient's daughter she is feeling generalized weakness for past few days to the point that she is unable to ambulate.     Urinary retention Hematuria 7/12 urinary retention, Foley catheter was inserted and 1220 mL urine was collected.  Urology consulted, hematuria due to bladder distention and decompression. --cont Foley --outpatient urology f/u in 10-14 days  # Urinary tract infection Patient with urinary incontinence and foul-smelling urine UA shows pyuria and urine culture growing >100K multiple species S/p Ceftriaxone  DC'd on 7/13, started Cefepime  due to leukocytosis --cont cefepime , day 5 of 5  AKI --cont MIVF  # prediabetes --A1c 6.2 --no need for BG checks and SSI   # Acute metabolic encephalopathy Baseline dementia Secondary to urinary retention, UTI, hyperglycemia and electrolyte abnormalities Patient at baseline has dementia but according to her daughter and caregiver she is more confused and has difficulty ambulating. At baseline she does not require an assistive device Expect improvement in patient's mental status following resolution of acute illness   # Hyponatremia, resolved  Improved with IV fluid hydration    # Hypokalemia --monitor and supplement PRN   # Hypophosphatemia  due to nutritional deficiency.  Patient has very low appetite.  Ensure protein supplement started.   --monitor and supplement PRN   # HTN, HLD, history of prior CVA No residual weakness 7/14 hold Aspirin  due to hematuria 7/14 discontinued  amlodipine  due to soft BP Hold hydrochlorothiazide  and lisinopril  due to dehydration and electrolyte imbalance Monitor BP and titrate medication accordingly   # COPD,  --cont Brovana  and Pulmicort  nebs     # Generalized weakness and difficulty ambulation due to above Continue fall precautions, turn patient every 2 hourly Ambulate with assistance Appreciate PT input, they recommend SNF on discharge     # Vitamin D  deficiency: started vitamin D  50,000 units p.o. weekly, follow with PCP to repeat vitamin D  level after 3 to 6 months.   # Iron  deficiency, transferrin 08% Patient has very poor oral intake and unable to swallow pills 7/12 Venofer  3 mg IV one-time dose given Patient will benefit from oral iron  supplement on discharge if oral intake improves   DVT prophylaxis: Lovenox  SQ Code Status: Full code  Family Communication: granddaughter updated at bedside today Level of care: Med-Surg Dispo:   The patient is from: home Anticipated d/c is to: SNF rehab Anticipated d/c date is: tomorrow   Subjective and Interval History:  Pt had no complaint.   Objective: Vitals:   01/19/24 0936 01/19/24 1655 01/19/24 1945 01/19/24 2020  BP: (!) 144/62 126/63  (!) 167/66  Pulse: 88 80  82  Resp: 19 18  18   Temp: 97.9 F (36.6 C) 97.7 F (36.5 C)  97.9 F (36.6 C)  TempSrc:    Oral  SpO2: 100% 100% 97% 97%  Weight:      Height:        Intake/Output Summary (Last 24 hours) at 01/19/2024 2208 Last data filed at 01/19/2024 2111 Gross per 24 hour  Intake 2358.8 ml  Output 2550 ml  Net -191.2 ml   Filed Weights   01/14/24 1015  Weight: 53.5 kg    Examination:   Constitutional: NAD, alert HEENT: conjunctivae and lids normal, EOMI CV: No cyanosis.   RESP: normal respiratory effort, on RA Foley present with hematuria  Data Reviewed: I have personally reviewed labs and imaging studies  Time spent: 50 minutes  Ellouise Haber, MD [facility] If 7PM-7AM, please contact  night-coverage 01/19/2024, 10:08 PM

## 2024-01-19 NOTE — Progress Notes (Signed)
 Physical Therapy Treatment Patient Details Name: Shelly Parks MRN: [id number] DOB: 04-Apr-1941 Today's Date: 01/19/2024   History of Present Illness 83 y.o. female with Past medical history of HTN, HLD, prediabetic, COPD, depression, and possible dementia.  Per daughter pt was feeling generalized weakness, polyuria, and polydipsia for past few days to the point that she is unable to ambulate.    PT Comments  Pt received in bed, very supportive grand-daughter at bedside. Pt required Min/ModA to transfer to EOB, good static sitting balance for several minutes, no c/o dizziness, indwelling catheter with ongoing scant blood present. Pt required MinA to wt shift and transition to standing at RW with cues for technique. Progressive gait training in hallway with assist to manage RW, no LOB however general unsteadiness due to weakness. Pt positioned to comfort in bedside chair with all needs in reach. Overall good progression, activity tolerance improving as well as cognition from recent UTI. Continue to recommend STR once medically cleared for d/c.   If plan is discharge home, recommend the following: A lot of help with walking and/or transfers;A lot of help with bathing/dressing/bathroom;Assistance with cooking/housework;Assist for transportation;Help with stairs or ramp for entrance   Can travel by private vehicle     Yes  Equipment Recommendations  None recommended by PT    Recommendations for Other Services       Precautions / Restrictions Precautions Precautions: Fall Recall of Precautions/Restrictions: Impaired Restrictions Weight Bearing Restrictions Per Provider Order: No     Mobility  Bed Mobility Overal bed mobility: Needs Assistance Bed Mobility: Supine to Sit       Sit to supine: Min assist, Mod assist, HOB elevated, Used rails   General bed mobility comments: VC for initiated and MIN A to shift hips/trunk to EOB and for BLE mgt back into bed    Transfers Overall transfer  level: Needs assistance Equipment used: Rolling walker (2 wheels) Transfers: Sit to/from Stand Sit to Stand: Min assist           General transfer comment: MinA to stand due to overall weakness    Ambulation/Gait Ambulation/Gait assistance: Contact guard assist, Min assist Gait Distance (Feet): 40 Feet Assistive device: Rolling walker (2 wheels) Gait Pattern/deviations: Decreased step length - right, Decreased step length - left, Narrow base of support Gait velocity: decreased     General Gait Details:  (Very slow cadence requiring continuous assist to propell RW forward and manage during changes in directions)   Stairs             Wheelchair Mobility     Tilt Bed    Modified Rankin (Stroke Patients Only)       Balance Overall balance assessment: Needs assistance Sitting-balance support: Feet supported, Single extremity supported, No upper extremity supported Sitting balance-Leahy Scale: Good     Standing balance support: Bilateral upper extremity supported, During functional activity, Reliant on assistive device for balance Standing balance-Leahy Scale: Fair Standing balance comment: Reliant on AD for gait, does not use device at baseline                            Communication Communication Communication: Impaired Factors Affecting Communication: Hearing impaired  Cognition Arousal: Alert Behavior During Therapy: WFL for tasks assessed/performed                           PT - Cognition Comments:  (Initially very confused due  to UTI, however clearing daily) Following commands: Impaired Following commands impaired: Follows one step commands with increased time, Follows multi-step commands inconsistently    Cueing Cueing Techniques: Verbal cues, Tactile cues, Visual cues  Exercises      General Comments General comments (skin integrity, edema, etc.):  (Indwelling catheter with scant blood present)      Pertinent  Vitals/Pain Pain Assessment Pain Assessment: No/denies pain    Home Living                          Prior Function            PT Goals (current goals can now be found in the care plan section) Acute Rehab PT Goals Patient Stated Goal: not at this time. Progress towards PT goals: Progressing toward goals    Frequency    Min 2X/week      PT Plan      Co-evaluation              AM-PAC PT 6 Clicks Mobility   Outcome Measure  Help needed turning from your back to your side while in a flat bed without using bedrails?: A Little Help needed moving from lying on your back to sitting on the side of a flat bed without using bedrails?: A Little Help needed moving to and from a bed to a chair (including a wheelchair)?: A Little Help needed standing up from a chair using your arms (e.g., wheelchair or bedside chair)?: A Little Help needed to walk in hospital room?: A Little Help needed climbing 3-5 steps with a railing? : Total 6 Click Score: 16    End of Session Equipment Utilized During Treatment: Gait belt Activity Tolerance: Patient tolerated treatment well;Patient limited by fatigue Patient left: in chair;with call bell/phone within reach;with family/visitor present Nurse Communication: Mobility status PT Visit Diagnosis: Unsteadiness on feet (R26.81);Other abnormalities of gait and mobility (R26.89);Muscle weakness (generalized) (M62.81);Difficulty in walking, not elsewhere classified (R26.2)     Time: 8398-8374 PT Time Calculation (min) (ACUTE ONLY): 24 min  Charges:    $Gait Training: 8-22 mins $Therapeutic Activity: 8-22 mins PT General Charges $$ ACUTE PT VISIT: 1 Visit                    Darice Bohr, PTA  [name] 01/19/2024, 5:37 PM

## 2024-01-20 DIAGNOSIS — R531 Weakness: Secondary | ICD-10-CM | POA: Diagnosis not present

## 2024-01-20 LAB — BASIC METABOLIC PANEL WITH GFR
Anion gap: 9 (ref 5–15)
BUN: 16 mg/dL (ref 8–23)
CO2: 21 mmol/L — ABNORMAL LOW (ref 22–32)
Calcium: 8.4 mg/dL — ABNORMAL LOW (ref 8.9–10.3)
Chloride: 108 mmol/L (ref 98–111)
Creatinine, Ser: 0.59 mg/dL (ref 0.44–1.00)
GFR, Estimated: >60 mL/min (ref >60)
Glucose, Bld: 108 mg/dL — ABNORMAL HIGH (ref 70–99)
Potassium: 3.9 mmol/L (ref 3.5–5.1)
Sodium: 138 mmol/L (ref 135–145)

## 2024-01-20 LAB — HEMOGLOBIN: Hemoglobin: 9 g/dL — ABNORMAL LOW (ref 12.0–15.0)

## 2024-01-20 LAB — PHOSPHORUS: Phosphorus: 2 mg/dL — ABNORMAL LOW (ref 2.5–4.6)

## 2024-01-20 LAB — MAGNESIUM: Magnesium: 1.4 mg/dL — ABNORMAL LOW (ref 1.7–2.4)

## 2024-01-20 MED ORDER — POTASSIUM PHOSPHATES 15 MMOLE/5ML IV SOLN
30.0000 mmol | Freq: Once | INTRAVENOUS | Status: AC
  Administered 2024-01-20: 30 mmol via INTRAVENOUS
  Filled 2024-01-20: qty 10

## 2024-01-20 MED ORDER — MAGNESIUM SULFATE 4 GM/100ML IV SOLN
4.0000 g | Freq: Once | INTRAVENOUS | Status: AC
  Administered 2024-01-20: 4 g via INTRAVENOUS
  Filled 2024-01-20: qty 100

## 2024-01-20 NOTE — Care Management Important Message (Signed)
 Important Message  Patient Details  Name: Shelly Parks MRN: [id number] Date of Birth: 03/05/1941   Important Message Given:  Yes - Medicare IM     Render Marley W, CMA 01/20/2024, 11:48 AM

## 2024-01-20 NOTE — Plan of Care (Signed)

## 2024-01-20 NOTE — Progress Notes (Signed)
 Occupational Therapy Treatment Patient Details Name: Shelly Parks MRN: [id number] DOB: 09/28/1940 Today's Date: 01/20/2024   History of present illness 83 y.o. female with Past medical history of HTN, HLD, prediabetic, COPD, depression, and possible dementia.  Per daughter pt was feeling generalized weakness, polyuria, and polydipsia for past few days to the point that she is unable to ambulate.   OT comments  Pt seen for OT treatment on this date. Upon arrival to room pt seated in recliner chair, agreeable to tx. Patient doff/donned socks with CGA while seated.  Pt requires Min A for sit to stand transfer with cuing for hand placement to maximize success and completed very light dynamic standing balance activity at RW level to promote weight shifting and reduce risk of LOB during standing components of LB ADLs and ADL transfers.  Patient required seated recovery periods x3 during standing tasks.  At end of session, patient returned to recliner chair with call button within close reach and all needs met.   Pt making good progress toward goals, will continue to follow POC. Discharge recommendation remains appropriate.        If plan is discharge home, recommend the following:  A little help with walking and/or transfers;Direct supervision/assist for medications management;Supervision due to cognitive status;Direct supervision/assist for financial management;Assist for transportation;Assistance with cooking/housework;Help with stairs or ramp for entrance;A little help with bathing/dressing/bathroom   Equipment Recommendations       Recommendations for Other Services      Precautions / Restrictions Precautions Precautions: Fall Recall of Precautions/Restrictions: Impaired Restrictions Weight Bearing Restrictions Per Provider Order: No       Mobility Bed Mobility                    Transfers Overall transfer level: Needs assistance Equipment used: Rolling walker (2  wheels) Transfers: Sit to/from Stand Sit to Stand: Min assist           General transfer comment: MinA to stand due to overall weakness     Balance Overall balance assessment: Needs assistance Sitting-balance support: Feet supported, Single extremity supported, No upper extremity supported Sitting balance-Leahy Scale: Good     Standing balance support: Bilateral upper extremity supported, During functional activity, Reliant on assistive device for balance Standing balance-Leahy Scale: Fair                             ADL either performed or assessed with clinical judgement   ADL Overall ADL's : Needs assistance/impaired                     Lower Body Dressing: Minimal assistance Lower Body Dressing Details (indicate cue type and reason): seated and standing simulation with increased time to process instructions                    Extremity/Trunk Assessment Upper Extremity Assessment Upper Extremity Assessment: Generalized weakness            Vision       Perception     Praxis     Communication Communication Communication: Impaired   Cognition Arousal: Alert Behavior During Therapy: WFL for tasks assessed/performed Cognition: History of cognitive impairments             OT - Cognition Comments: Pleasant, agreeable                 Following commands: Impaired Following commands impaired: Follows  one step commands inconsistently      Cueing   Cueing Techniques: Verbal cues, Tactile cues, Visual cues  Exercises Other Exercises Other Exercises: education on hand placement with sit to stand transfers and overall technique to maximize independence    Shoulder Instructions       General Comments      Pertinent Vitals/ Pain       Pain Assessment Pain Assessment: No/denies pain  Home Living                                          Prior Functioning/Environment              Frequency  Min  2X/week        Progress Toward Goals  OT Goals(current goals can now be found in the care plan section)  Progress towards OT goals: Progressing toward goals     Plan      Co-evaluation                 AM-PAC OT 6 Clicks Daily Activity     Outcome Measure                    End of Session Equipment Utilized During Treatment: Rolling walker (2 wheels)  OT Visit Diagnosis: Other abnormalities of gait and mobility (R26.89);Muscle weakness (generalized) (M62.81);Other symptoms and signs involving cognitive function   Activity Tolerance Patient tolerated treatment well   Patient Left in chair;with call bell/phone within reach   Nurse Communication          Time: 1240-1256 OT Time Calculation (min): 16 min  Charges: OT General Charges $OT Visit: 1 Visit   Harlene Sharps OTR/L    Harlene LITTIE Sharps 01/20/2024, 1:09 PM

## 2024-01-20 NOTE — Progress Notes (Signed)
 PROGRESS NOTE    Shelly Parks  FMW:[id number] DOB: 1941-05-06 DOA: 01/14/2024 PCP: Gerome Tillman CROME, FNP  118A/[id number]-AA  LOS: 3 days   Brief hospital course:   Assessment & Plan: Shelly Parks is a 83 y.o. female with past medical history of HTN, HLD, prediabetic, COPD, depression, dementia as reviewed from EMR, patient is very poor historian, AAO x 1.  EMR reviewed, got information from patient's daughter at bedside and from ED attending.  As per patient's daughter she is feeling generalized weakness for past few days to the point that she is unable to ambulate.     Urinary retention Hematuria 7/12 urinary retention, Foley catheter was inserted and 1220 mL urine was collected.  Urology consulted, hematuria due to bladder distention and decompression. --urine less bloody today --cont Foley --outpatient urology f/u in 10-14 days  # Urinary tract infection Patient with urinary incontinence and foul-smelling urine UA shows pyuria and urine culture growing >100K multiple species S/p Ceftriaxone  DC'd on 7/13, started Cefepime  due to leukocytosis --completed 5 days of ceftriaxone /cefepime   AKI --cont MIVF  # prediabetes --A1c 6.2 --no need for BG checks and SSI   # Acute metabolic encephalopathy Baseline dementia Secondary to urinary retention, UTI, hyperglycemia and electrolyte abnormalities Patient at baseline has dementia but according to her daughter and caregiver she is more confused and has difficulty ambulating. At baseline she does not require an assistive device Expect improvement in patient's mental status following resolution of acute illness   # Hyponatremia, resolved  Improved with IV fluid hydration    # Hypokalemia --supplement PRN  Hypomag --supplement with IV mag    # Hypophosphatemia  due to nutritional deficiency.  Patient has very low appetite.  Ensure protein supplement started.   --supplement with IV phos   # HTN, HLD, history of prior CVA No  residual weakness 7/14 hold Aspirin  due to hematuria 7/14 discontinued amlodipine  due to soft BP Hold hydrochlorothiazide  and lisinopril  due to dehydration and electrolyte imbalance Monitor BP and titrate medication accordingly   # COPD,  --cont Brovana  and Pulmicort  nebs     # Generalized weakness and difficulty ambulation due to above Continue fall precautions, turn patient every 2 hourly Ambulate with assistance Appreciate PT input, they recommend SNF on discharge     # Vitamin D  deficiency: started vitamin D  50,000 units p.o. weekly, follow with PCP to repeat vitamin D  level after 3 to 6 months.   # Iron  deficiency, transferrin 08% Patient has very poor oral intake and unable to swallow pills 7/12 Venofer  3 mg IV one-time dose given Patient will benefit from oral iron  supplement on discharge if oral intake improves   DVT prophylaxis: Lovenox  SQ Code Status: Full code  Family Communication:  Level of care: Med-Surg Dispo:   The patient is from: home Anticipated d/c is to: SNF rehab Anticipated d/c date is: tomorrow   Subjective and Interval History:  Pt reported doing ok.  No complaint.   Objective: Vitals:   01/20/24 0451 01/20/24 0721 01/20/24 0844 01/20/24 1623  BP: (!) 146/68  (!) 140/59 133/67  Pulse: 82  80 80  Resp: 18  16 16   Temp: 98 F (36.7 C)  98 F (36.7 C) 98.8 F (37.1 C)  TempSrc: Oral   Oral  SpO2: 100% 99% 100% 100%  Weight:      Height:        Intake/Output Summary (Last 24 hours) at 01/20/2024 1919 Last data filed at 01/20/2024 1700 Gross per  24 hour  Intake 1081.61 ml  Output 2600 ml  Net -1518.39 ml   Filed Weights   01/14/24 1015  Weight: 53.5 kg    Examination:   Constitutional: NAD, alert, oriented to person and place HEENT: conjunctivae and lids normal, EOMI CV: No cyanosis.   RESP: normal respiratory effort, on RA Neuro: II - XII grossly intact.   Psych: Normal mood and affect.    Foley present, urine clearing  up  Data Reviewed: I have personally reviewed labs and imaging studies  Time spent: 50 minutes  Ellouise Haber, MD [facility] If 7PM-7AM, please contact night-coverage 01/20/2024, 7:19 PM

## 2024-01-20 NOTE — Progress Notes (Signed)
 Physical Therapy Treatment Patient Details Name: Shelly Parks MRN: [id number] DOB: 29-May-1941 Today's Date: 01/20/2024   History of Present Illness 83 y.o. female with Past medical history of HTN, HLD, prediabetic, COPD, depression, and possible dementia.  Per daughter pt was feeling generalized weakness, polyuria, and polydipsia for past few days to the point that she is unable to ambulate.    PT Comments  Pt initially resistant to doing much as she sat in recliner much of the day, but PT was able to encourage some activity/ambulation.  She continues to display slow, guarded, walker reliant gait, but managed ~70 ft.  She needed heavy cuing, did get stuck on stationary obstacles a few times and tended to keep walker too far from her w/o constant cuing, but no LOBs.  Pt will benefit from continued PT to address functional limitations.     If plan is discharge home, recommend the following: A lot of help with walking and/or transfers;A lot of help with bathing/dressing/bathroom;Assistance with cooking/housework;Assist for transportation;Help with stairs or ramp for entrance   Can travel by private vehicle     Yes  Equipment Recommendations  None recommended by PT    Recommendations for Other Services       Precautions / Restrictions Precautions Precautions: Fall Recall of Precautions/Restrictions: Impaired Restrictions Weight Bearing Restrictions Per Provider Order: No     Mobility  Bed Mobility Overal bed mobility: Needs Assistance Bed Mobility: Supine to Sit, Sit to Supine     Supine to sit: Min assist Sit to supine: Min assist   General bed mobility comments: Good effort, heavy cuing to initiate movement, unable to complete getting up/down but with light assist completed    Transfers Overall transfer level: Needs assistance Equipment used: Rolling walker (2 wheels) Transfers: Sit to/from Stand Sit to Stand: Min assist           General transfer comment: despite much  cuing and set up pt needed direct assist up to standing    Ambulation/Gait Ambulation/Gait assistance: Contact guard assist, Min assist Gait Distance (Feet): 70 Feet Assistive device: Rolling walker (2 wheels)         General Gait Details: short, choppy strides and slow and deliberate movements. Did get stuck on stationary obstacles multiple times but no LOBs or overt safety issues.  Needing almost constant directional cuing.  More than once getting walker too far out in front despite cuing   Stairs             Wheelchair Mobility     Tilt Bed    Modified Rankin (Stroke Patients Only)       Balance Overall balance assessment: Needs assistance Sitting-balance support: Feet supported, Single extremity supported, No upper extremity supported Sitting balance-Leahy Scale: Good     Standing balance support: Bilateral upper extremity supported, During functional activity, Reliant on assistive device for balance Standing balance-Leahy Scale: Fair Standing balance comment: Reliant on AD for gait, does not use device at baseline                            Communication Communication Communication: Impaired Factors Affecting Communication: Hearing impaired;Reduced clarity of speech  Cognition Arousal: Alert Behavior During Therapy: WFL for tasks assessed/performed                             Following commands: Impaired Following commands impaired: Follows one step commands inconsistently  Cueing Cueing Techniques: Verbal cues, Tactile cues, Visual cues  Exercises      General Comments        Pertinent Vitals/Pain Pain Assessment Pain Assessment: No/denies pain    Home Living                          Prior Function            PT Goals (current goals can now be found in the care plan section) Progress towards PT goals: Progressing toward goals    Frequency    Min 2X/week      PT Plan      Co-evaluation               AM-PAC PT 6 Clicks Mobility   Outcome Measure  Help needed turning from your back to your side while in a flat bed without using bedrails?: A Little Help needed moving from lying on your back to sitting on the side of a flat bed without using bedrails?: A Little Help needed moving to and from a bed to a chair (including a wheelchair)?: A Little Help needed standing up from a chair using your arms (e.g., wheelchair or bedside chair)?: A Little Help needed to walk in hospital room?: A Little Help needed climbing 3-5 steps with a railing? : Total 6 Click Score: 16    End of Session Equipment Utilized During Treatment: Gait belt Activity Tolerance: Patient tolerated treatment well;Patient limited by fatigue Patient left: with bed alarm set;with nursing/sitter in room;with call bell/phone within reach (pt up in recliner much of the day) Nurse Communication: Mobility status PT Visit Diagnosis: Unsteadiness on feet (R26.81);Other abnormalities of gait and mobility (R26.89);Muscle weakness (generalized) (M62.81);Difficulty in walking, not elsewhere classified (R26.2)     Time: 8362-8345 PT Time Calculation (min) (ACUTE ONLY): 17 min  Charges:    $Gait Training: 8-22 mins PT General Charges $$ ACUTE PT VISIT: 1 Visit                     Carmin JONELLE Deed, DPT 01/20/2024, 5:43 PM

## 2024-01-20 NOTE — Plan of Care (Signed)

## 2024-01-21 DIAGNOSIS — R531 Weakness: Secondary | ICD-10-CM | POA: Diagnosis not present

## 2024-01-21 LAB — PHOSPHORUS: Phosphorus: 2 mg/dL — ABNORMAL LOW (ref 2.5–4.6)

## 2024-01-21 LAB — MAGNESIUM: Magnesium: 1.8 mg/dL (ref 1.7–2.4)

## 2024-01-21 MED ORDER — PHOS-NAK 280-160-250 MG PO PACK: 1.0000 | PACK | Freq: Three times a day (TID) | ORAL | Status: AC

## 2024-01-21 MED ORDER — IRON (FERROUS SULFATE) 325 (65 FE) MG PO TABS: 1.0000 | ORAL_TABLET | Freq: Every day | ORAL | Status: AC

## 2024-01-21 MED ORDER — POTASSIUM PHOSPHATES 15 MMOLE/5ML IV SOLN
30.0000 mmol | Freq: Once | INTRAVENOUS | Status: AC
  Administered 2024-01-21: 30 mmol via INTRAVENOUS
  Filled 2024-01-21: qty 10

## 2024-01-21 MED ORDER — LISINOPRIL 20 MG PO TABS
40.0000 mg | ORAL_TABLET | Freq: Every day | ORAL | Status: DC
  Administered 2024-01-21: 40 mg via ORAL
  Filled 2024-01-21: qty 2

## 2024-01-21 MED ORDER — ASCORBIC ACID 500 MG PO TABS: 500.0000 mg | ORAL_TABLET | Freq: Every day | ORAL | Status: AC

## 2024-01-21 MED ORDER — VITAMIN D (ERGOCALCIFEROL) 1.25 MG (50000 UNIT) PO CAPS: 50000.0000 [IU] | ORAL_CAPSULE | ORAL | Status: AC

## 2024-01-21 MED ORDER — MAGNESIUM SULFATE 4 GM/100ML IV SOLN
4.0000 g | Freq: Once | INTRAVENOUS | Status: AC
  Administered 2024-01-21: 4 g via INTRAVENOUS
  Filled 2024-01-21: qty 100

## 2024-01-21 MED ORDER — HYDROCHLOROTHIAZIDE 25 MG PO TABS
25.0000 mg | ORAL_TABLET | Freq: Every morning | ORAL | Status: DC
  Administered 2024-01-21: 25 mg via ORAL
  Filled 2024-01-21: qty 1

## 2024-01-21 MED ORDER — ENSURE PLUS HIGH PROTEIN PO LIQD: 237.0000 mL | Freq: Three times a day (TID) | ORAL | Status: AC

## 2024-01-21 MED ORDER — MAG-OXIDE 200 MG PO TABS: 1.0000 | ORAL_TABLET | Freq: Three times a day (TID) | ORAL | Status: AC

## 2024-01-21 MED ORDER — POLYETHYLENE GLYCOL 3350 17 G PO PACK
17.0000 g | PACK | Freq: Two times a day (BID) | ORAL | Status: DC | PRN
  Administered 2024-01-21: 17 g via ORAL
  Filled 2024-01-21: qty 1

## 2024-01-21 NOTE — Discharge Summary (Addendum)
 Physician Discharge Summary   Shelly Parks  female DOB: 1940/07/21  FMW:[id number]  PCP: Gerome Tillman CROME, FNP  Admit date: 01/14/2024 Discharge date: 01/21/2024  Admitted From: home Disposition:  SNF rehab Granddaughter updated on the phone prior to discharge. CODE STATUS: Full code  Discharge Instructions     Discharge instructions   Complete by: As directed    Check phos, mag and potassium level about 1 week after discharge. [facility] Course:  For full details, please see H&P, progress notes, consult notes and ancillary notes.  Briefly,  Shelly Parks is a 83 y.o. female with past medical history of HTN, prediabetic, COPD, dementia who presented with generalized weakness for past few days to the point that she was unable to ambulate.     Urinary retention Hematuria 7/12 urinary retention, Foley catheter was inserted and 1220 mL urine was collected.  Urology consulted, hematuria due to bladder distention and decompression.  Hematuria resolved prior to discharge.   --Pt was discharged with Foley and will f/u outpatient urology Dr. Annemarie in 10-14 days.   # Urinary tract infection Patient with urinary incontinence and foul-smelling urine.  UA shows pyuria and urine culture growing >100K multiple species. --completed 5 days of ceftriaxone /cefepime    AKI --Cr peaked at 1.59, improved with MIVF.  Cr back to baseline 0.59 prior to discharge.   # prediabetes --A1c 6.2.  not on hypoglycemics at home. --no need for BG checks and SSI   # Acute metabolic encephalopathy Baseline dementia Secondary to urinary retention, UTI, hyperglycemia and electrolyte abnormalities Patient at baseline has dementia but according to her daughter and caregiver she was more confused and has difficulty ambulating PTA.  Mental status improved prior to discharge, per family.  # Hyponatremia, resolved  Improved with IV fluid hydration.   # Hypokalemia --supplement PRN    Hypomag --supplement with IV mag  --cont oral mag supplementation for 1 more month after discharge.   # Hypophosphatemia  due to nutritional deficiency.  Patient has very low appetite.    --supplement with IV phos during hospitalization.  Discharged on 5 more days of oral Phos-NaK.   # HTN --cont home amlodipine  --home hydrochlorothiazide  and lisinopril  held during hospitalization due to AKI, but resumed prior to discharge.  History of prior CVA --home ASA held due to hematuria, to be resumed after discharge.   # COPD,  --received Brovana  and Pulmicort  nebs during hospitalization.  Resume home bronchodilators after discharge (see below).   # Generalized weakness  and difficulty ambulation due to above --PT/OT, SNF rehab   # Vitamin D  deficiency:  started vitamin D  50,000 units p.o. weekly, follow with PCP to repeat vitamin D  level after 3 to 6 months.   # Iron  deficiency,  7/12 Venofer  3 mg IV one-time dose given --discharged on oral iron  supplement.   Unless noted above, medications under STOP list are ones pt was not taking PTA.  Discharge Diagnoses:  Principal Problem:   Weakness generalized Active Problems:   UTI (urinary tract infection)   30 Day Unplanned Readmission Risk Score    Flowsheet Row ED to Hosp-Admission (Current) from 01/14/2024 in [facility] 1C MEDICAL TELEMETRY  30 Day Unplanned Readmission Risk Score (%) 19.64 Filed at 01/21/2024 1200    This score is the patient's risk of an unplanned readmission within 30 days of being discharged (0 -100%). The score is based on dignosis, age, lab data, medications, orders, and past  utilization.   Low:  0-14.9   Medium: 15-21.9   High: 22-29.9   Extreme: 30 and above         Discharge Instructions:  Allergies as of 01/21/2024   No Known Allergies      Medication List     STOP taking these medications    albuterol  108 (90 Base) MCG/ACT inhaler Commonly known as: VENTOLIN   HFA   pantoprazole  40 MG tablet Commonly known as: PROTONIX    venlafaxine  XR 37.5 MG 24 hr capsule Commonly known as: EFFEXOR -XR       TAKE these medications    amLODipine  10 MG tablet Commonly known as: NORVASC  Take 10 mg by mouth daily.   ascorbic acid  500 MG tablet Commonly known as: VITAMIN C  Take 1 tablet (500 mg total) by mouth daily.   aspirin  EC 81 MG tablet Take 1 tablet (81 mg total) by mouth daily. Swallow whole.   feeding supplement Liqd Take 237 mLs by mouth 3 (three) times daily between meals.   hydrochlorothiazide  25 MG tablet Commonly known as: HYDRODIURIL  Take 25 mg by mouth every morning.   Iron  (Ferrous Sulfate ) 325 (65 Fe) MG Tabs Take 1 tablet by mouth daily.   lisinopril  40 MG tablet Commonly known as: ZESTRIL  Take 40 mg by mouth daily.   Mag-Oxide 200 MG Tabs Generic drug: Magnesium  Oxide -Mg Supplement Take 1 tablet (200 mg total) by mouth 3 (three) times daily.   Phos-NaK 280-160-250 MG Pack Generic drug: potassium & sodium phosphates Take 1 packet by mouth 3 (three) times daily for 5 days.   Vitamin D  (Ergocalciferol ) 1.25 MG (50000 UNIT) Caps capsule Commonly known as: DRISDOL  Take 1 capsule (50,000 Units total) by mouth every 7 (seven) days. Start taking on: January 23, 2024   Wixela Inhub 100-50 MCG/ACT Aepb Generic drug: fluticasone -salmeterol Inhale 1 puff into the lungs.  Inhale 1 inhalation into the lungs every 12 (twelve) hours         Contact information for follow-up providers     Gerome Tillman CROME, FNP Follow up.   Specialty: Family Medicine Why: hospital follow up Contact information: [address] [place] [address] [phone]         Twylla Glendia BROCKS, MD Follow up in 10 day(s).   Specialty: Urology Contact information: [address] [place] [address] [phone]              Contact information for after-discharge care     Destination     [facility] .   Service: Skilled Nursing Contact information: [address] [place]  [address] [phone]                     No Known Allergies   The results of significant diagnostics from this hospitalization (including imaging, microbiology, ancillary and laboratory) are listed below for reference.   Consultations:   Procedures/Studies: DG Chest Port 1 View Result Date: 01/16/2024 CLINICAL DATA:  141880 SOB (shortness of breath) 141880 d EXAM: PORTABLE CHEST 1 VIEW COMPARISON:  March 10, 2022 FINDINGS: The cardiomediastinal silhouette is unchanged in contour.Atherosclerotic calcifications of the tortuous thoracic aorta. No pleural effusion. No pneumothorax. No acute pleuroparenchymal abnormality. IMPRESSION: No acute cardiopulmonary abnormality. Electronically Signed   By: Corean Salter M.D.   On: 01/16/2024 17:06   CT Head Wo Contrast Result Date: 01/14/2024 CLINICAL DATA:  Mental status change, unknown cause EXAM: CT HEAD WITHOUT CONTRAST TECHNIQUE: Contiguous axial images  were obtained from the base of the skull through the vertex without intravenous contrast. RADIATION DOSE REDUCTION: This exam was performed according to the departmental dose-optimization program which includes automated exposure control, adjustment of the mA and/or kV according to patient size and/or use of iterative reconstruction technique. COMPARISON:  March 11, 2022 FINDINGS: Brain: Proportional prominence of the ventricles and sulci, consistent with diffuse cerebral parenchymal volume loss. The ventricles otherwise maintained midline position without midline shift. Gray-white differentiation is preserved.Confluent periventricular and subcortical white matter hypoattenuation, most consistent with changes of severe chronic ischemic microvascular disease.No evidence of acute territorial infarction, extra-axial fluid collection, hemorrhage, or mass lesion. The basilar cisterns are  patent without downward herniation. The cerebellar hemispheres and vermis are well formed without mass lesion or focal attenuation abnormality. Vascular: No hyperdense vessel. Calcified atherosclerotic plaque within the cavernous/supraclinoid internal carotid arteries. Skull: Normal. Negative for fracture or focal lesion. Sinuses/Orbits: The paranasal sinuses and mastoids are clear.The globes appear intact. No retrobulbar hematoma. Other: None. IMPRESSION: 1. No acute intracranial abnormality, specifically, no acute hemorrhage, territorial infarction, or intracranial mass. 2. Global cerebral volume loss with sequelae of advanced chronic ischemic microvascular disease. Electronically Signed   By: Rogelia Myers M.D.   On: 01/14/2024 13:01      Labs: BNP (last 3 results) No results for input(s): BNP in the last 8760 hours. Basic Metabolic Panel: Recent Labs  Lab 01/16/24 0639 01/17/24 0347 01/18/24 0544 01/19/24 0545 01/20/24 0437 01/21/24 0449  NA 136 138 142 142 138  --   K 3.2* 3.1* 4.0 4.2 3.9  --   CL 105 108 112* 113* 108  --   CO2 21* 20* 23 22 21*  --   GLUCOSE 203* 172* 97 106* 108*  --   BUN 43* 52* 43* 26* 16  --   CREATININE 1.20* 1.59* 0.88 0.72 0.59  --   CALCIUM  9.2 8.7* 8.9 8.8* 8.4*  --   MG 1.9 2.2 2.1 1.7 1.4* 1.8  PHOS 3.6 3.6 1.6* 1.6* 2.0* 2.0*   Liver Function Tests: No results for input(s): AST, ALT, ALKPHOS, BILITOT, PROT, ALBUMIN in the last 168 hours. No results for input(s): LIPASE, AMYLASE in the last 168 hours. No results for input(s): AMMONIA in the last 168 hours. CBC: Recent Labs  Lab 01/15/24 0311 01/16/24 0639 01/17/24 0347 01/18/24 0544 01/19/24 0545 01/20/24 0437  WBC 8.8 18.5* 21.2* 15.1* 9.6  --   HGB 11.6* 11.2* 10.4* 9.0* 9.5* 9.0*  HCT 33.6* 32.7* 30.1* 27.6* 28.3*  --   MCV 88.0 89.1 88.8 92.0 90.4  --   PLT 353 388 351 343 376  --    Cardiac Enzymes: No results for input(s): CKTOTAL, CKMB, CKMBINDEX,  TROPONINI in the last 168 hours. BNP: Invalid input(s): POCBNP CBG: Recent Labs  Lab 01/18/24 0812 01/18/24 1213 01/18/24 1711 01/18/24 2027 01/19/24 0933  GLUCAP 95 159* 177* 125* 98   D-Dimer No results for input(s): DDIMER in the last 72 hours. Hgb A1c No results for input(s): HGBA1C in the last 72 hours. Lipid Profile No results for input(s): CHOL, HDL, LDLCALC, TRIG, CHOLHDL, LDLDIRECT in the last 72 hours. Thyroid function studies No results for input(s): TSH, T4TOTAL, T3FREE, THYROIDAB in the last 72 hours.  Invalid input(s): FREET3 Anemia work up No results for input(s): VITAMINB12, FOLATE, FERRITIN, TIBC, IRON , RETICCTPCT in the last 72 hours. Urinalysis    Component Value Date/Time   COLORURINE AMBER (A) 01/14/2024 1744   APPEARANCEUR TURBID (A) 01/14/2024 1744  APPEARANCEUR Clear 12/31/2017 1104   LABSPEC 1.012 01/14/2024 1744   PHURINE 8.0 01/14/2024 1744   GLUCOSEU NEGATIVE 01/14/2024 1744   HGBUR SMALL (A) 01/14/2024 1744   BILIRUBINUR NEGATIVE 01/14/2024 1744   BILIRUBINUR Negative 12/31/2017 1104   KETONESUR NEGATIVE 01/14/2024 1744   PROTEINUR 100 (A) 01/14/2024 1744   NITRITE NEGATIVE 01/14/2024 1744   LEUKOCYTESUR LARGE (A) 01/14/2024 1744   Sepsis Labs Recent Labs  Lab 01/16/24 0639 01/17/24 0347 01/18/24 0544 01/19/24 0545  WBC 18.5* 21.2* 15.1* 9.6   Microbiology Recent Results (from the past 240 hours)  Urine Culture     Status: Abnormal   Collection Time: 01/14/24  5:44 PM   Specimen: Urine, Random  Result Value Ref Range Status   Specimen Description   Final    URINE, RANDOM Performed at [facility], [address] [place], [place] [address]    Special Requests   Final    NONE Reflexed from [phone] Performed at [facility], [address] [place], [place] [address]    Culture (A)  Final    >=100,000 COLONIES/mL MULTIPLE SPECIES PRESENT, SUGGEST RECOLLECTION    Report Status 01/16/2024 FINAL  Final  Resp panel by RT-PCR (RSV, Flu A&B, Covid) Anterior Nasal Swab     Status: None   Collection Time: 01/16/24  5:46 PM   Specimen: Anterior Nasal Swab  Result Value Ref Range Status   SARS Coronavirus 2 by RT PCR NEGATIVE NEGATIVE Final    Comment: (NOTE) SARS-CoV-2 target nucleic acids are NOT DETECTED.  The SARS-CoV-2 RNA is generally detectable in upper respiratory specimens during the acute phase of infection. The lowest concentration of SARS-CoV-2 viral copies this assay can detect is 138 copies/mL. A negative result does not preclude SARS-Cov-2 infection and should not be used as the sole basis for treatment or other patient management decisions. A negative result may occur with  improper specimen collection/handling, submission of specimen other than nasopharyngeal swab, presence of viral mutation(s) within the areas targeted by this assay, and inadequate number of viral copies(<138 copies/mL). A negative result must be combined with clinical observations, patient history, and epidemiological information. The expected result is Negative.  Fact Sheet for Patients:  BloggerCourse.com  Fact Sheet for Healthcare Providers:  SeriousBroker.it  This test is no t yet approved or cleared by the [place]  FDA and  has been authorized for detection and/or diagnosis of SARS-CoV-2 by FDA under an Emergency Use Authorization (EUA). This EUA will remain  in effect (meaning this test can be used) for the duration of the COVID-19 declaration under Section 564(b)(1) of the Act, 21 U.S.C.section 360bbb-3(b)(1), unless the authorization is terminated  or revoked sooner.       Influenza A by PCR NEGATIVE NEGATIVE Final   Influenza B by PCR NEGATIVE NEGATIVE Final    Comment: (NOTE) The Xpert Xpress SARS-CoV-2/FLU/RSV plus assay is intended as an aid in the diagnosis of influenza from Nasopharyngeal  swab specimens and should not be used as a sole basis for treatment. Nasal washings and aspirates are unacceptable for Xpert Xpress SARS-CoV-2/FLU/RSV testing.  Fact Sheet for Patients: BloggerCourse.com  Fact Sheet for Healthcare Providers: SeriousBroker.it  This test is not yet approved or cleared by the [place]  FDA and has been authorized for detection and/or diagnosis of SARS-CoV-2 by FDA under an Emergency Use Authorization (EUA). This EUA will remain in effect (meaning this test can be used) for the duration of the COVID-19 declaration under Section 564(b)(1) of the  Act, 21 U.S.C. section 360bbb-3(b)(1), unless the authorization is terminated or revoked.     Resp Syncytial Virus by PCR NEGATIVE NEGATIVE Final    Comment: (NOTE) Fact Sheet for Patients: BloggerCourse.com  Fact Sheet for Healthcare Providers: SeriousBroker.it  This test is not yet approved or cleared by the [place]  FDA and has been authorized for detection and/or diagnosis of SARS-CoV-2 by FDA under an Emergency Use Authorization (EUA). This EUA will remain in effect (meaning this test can be used) for the duration of the COVID-19 declaration under Section 564(b)(1) of the Act, 21 U.S.C. section 360bbb-3(b)(1), unless the authorization is terminated or revoked.  Performed at [facility], [address] [place], [place] [address]   Respiratory (~20 pathogens) panel by PCR     Status: None   Collection Time: 01/16/24  5:46 PM   Specimen: Nasopharyngeal Swab; Respiratory  Result Value Ref Range Status   Adenovirus NOT DETECTED NOT DETECTED Final   Coronavirus 229E NOT DETECTED NOT DETECTED Final    Comment: (NOTE) The Coronavirus on the Respiratory Panel, DOES NOT test for the novel  Coronavirus (2019 nCoV)    Coronavirus HKU1 NOT DETECTED NOT DETECTED Final   Coronavirus NL63  NOT DETECTED NOT DETECTED Final   Coronavirus OC43 NOT DETECTED NOT DETECTED Final   Metapneumovirus NOT DETECTED NOT DETECTED Final   Rhinovirus / Enterovirus NOT DETECTED NOT DETECTED Final   Influenza A NOT DETECTED NOT DETECTED Final   Influenza B NOT DETECTED NOT DETECTED Final   Parainfluenza Virus 1 NOT DETECTED NOT DETECTED Final   Parainfluenza Virus 2 NOT DETECTED NOT DETECTED Final   Parainfluenza Virus 3 NOT DETECTED NOT DETECTED Final   Parainfluenza Virus 4 NOT DETECTED NOT DETECTED Final   Respiratory Syncytial Virus NOT DETECTED NOT DETECTED Final   Bordetella pertussis NOT DETECTED NOT DETECTED Final   Bordetella Parapertussis NOT DETECTED NOT DETECTED Final   Chlamydophila pneumoniae NOT DETECTED NOT DETECTED Final   Mycoplasma pneumoniae NOT DETECTED NOT DETECTED Final    Comment: Performed at [facility], [address], [place], [place] [address]  Urine Culture (for pregnant, neutropenic or urologic patients or patients with an indwelling urinary catheter)     Status: None   Collection Time: 01/17/24 10:30 AM   Specimen: Urine, Catheterized  Result Value Ref Range Status   Specimen Description   Final    URINE, CATHETERIZED Performed at [facility], [address], [place], [place] [address]    Special Requests   Final    NONE Performed at [facility], [address] [place], [place] [address]    Culture   Final    NO GROWTH Performed at [facility], [address], [place], [place] [address]    Report Status 01/18/2024 FINAL  Final  Culture, blood (Routine X 2) w Reflex to ID Panel     Status: None (Preliminary result)   Collection Time: 01/17/24 12:21 PM   Specimen: BLOOD  Result Value Ref Range Status   Specimen Description BLOOD LEFT ANTECUBITAL  Final   Special Requests   Final    BOTTLES DRAWN AEROBIC AND ANAEROBIC Blood Culture adequate volume   Culture   Final    NO GROWTH 4 DAYS Performed at [facility], [address] [place], [place] [address]    Report Status PENDING  Incomplete  Culture, blood (Routine X 2) w Reflex to ID Panel     Status: None (Preliminary  result)   Collection Time: 01/17/24 12:32 PM   Specimen: BLOOD LEFT HAND  Result Value Ref Range Status   Specimen Description BLOOD LEFT HAND  Final   Special Requests   Final    BOTTLES DRAWN AEROBIC AND ANAEROBIC Blood Culture adequate volume   Culture   Final    NO GROWTH 4 DAYS Performed at [facility], [address], [place], [place] [address]    Report Status PENDING  Incomplete     Total time spend on discharging this patient, including the last patient exam, discussing the hospital stay, instructions for ongoing care as it relates to all pertinent caregivers, as well as preparing the medical discharge records, prescriptions, and/or referrals as applicable, is 40 minutes.    Ellouise Haber, MD  [facility] 01/21/2024, 2:41 PM

## 2024-01-21 NOTE — Plan of Care (Signed)
  Problem: Education: Goal: Ability to describe self-care measures that may prevent or decrease complications (Diabetes Survival Skills Education) will improve Outcome: Progressing Goal: Individualized Educational Video(s) Outcome: Progressing   Problem: Coping: Goal: Ability to adjust to condition or change in health will improve Outcome: Progressing   Problem: Metabolic: Goal: Ability to maintain appropriate glucose levels will improve Outcome: Progressing   Problem: Nutritional: Goal: Maintenance of adequate nutrition will improve Outcome: Progressing Goal: Progress toward achieving an optimal weight will improve Outcome: Progressing   Problem: Skin Integrity: Goal: Risk for impaired skin integrity will decrease Outcome: Progressing   Problem: Tissue Perfusion: Goal: Adequacy of tissue perfusion will improve Outcome: Progressing   Problem: Clinical Measurements: Goal: Ability to maintain clinical measurements within normal limits will improve Outcome: Progressing Goal: Will remain free from infection Outcome: Progressing Goal: Diagnostic test results will improve Outcome: Progressing Goal: Respiratory complications will improve Outcome: Progressing Goal: Cardiovascular complication will be avoided Outcome: Progressing

## 2024-01-21 NOTE — TOC Transition Note (Signed)
 Transition of Care [facility]) - Discharge Note   Patient Details  Name: Shelly Parks MRN: [id number] Date of Birth: 1940-09-26  [facility] CM/SW Contact:  Alvaro Louder, LCSW Phone Number: 01/21/2024, 1:25 PM   Clinical Narrative:   LCSWA confirmed with MD that patient is stable for discharge. LCSWA notified daughter Amy and she is in agreement with discharge. LCSWA confirmed bed is available at [facility]. Patients daughter and grandaughter will be transporting her to SNF after 5:00 PM.   Number to call report: [phone] RM: 305P   TOC Signing off    Final next level of care: Skilled Nursing Facility Barriers to Discharge: No Barriers Identified   Patient Goals and CMS Choice Patient states their goals for this hospitalization and ongoing recovery are:: UTA          Discharge Placement              Patient chooses bed at: [facility] Patient to be transferred to facility by: Grandaughter Name of family member notified: Amy Patient and family notified of of transfer: 01/21/24  Discharge Plan and Services Additional resources added to the After Visit Summary for     Discharge Planning Services: CM Consult Post Acute Care Choice: Skilled Nursing Facility                               Social Drivers of Health (SDOH) Interventions SDOH Screenings   Tobacco Use: Medium Risk (01/14/2024)     Readmission Risk Interventions     No data to display

## 2024-01-21 NOTE — Progress Notes (Signed)
 Patient report given to RN Albino. All questions are answered via phone. No any questions or concern at this time.

## 2024-01-22 LAB — CULTURE, BLOOD (ROUTINE X 2)
Culture: NO GROWTH
Culture: NO GROWTH
Special Requests: ADEQUATE
Special Requests: ADEQUATE

## 2024-01-28 ENCOUNTER — Encounter: Payer: Self-pay | Admitting: Urology

## 2024-01-28 ENCOUNTER — Ambulatory Visit (INDEPENDENT_AMBULATORY_CARE_PROVIDER_SITE_OTHER): Admitting: Urology

## 2024-01-28 ENCOUNTER — Encounter: Payer: Self-pay | Admitting: Physician Assistant

## 2024-01-28 ENCOUNTER — Ambulatory Visit (INDEPENDENT_AMBULATORY_CARE_PROVIDER_SITE_OTHER): Admitting: Physician Assistant

## 2024-01-28 VITALS — BP 101/64 | HR 76 | Wt 110.1 lb

## 2024-01-28 VITALS — BP 105/66 | HR 79

## 2024-01-28 DIAGNOSIS — R339 Retention of urine, unspecified: Secondary | ICD-10-CM

## 2024-01-28 DIAGNOSIS — R310 Gross hematuria: Secondary | ICD-10-CM

## 2024-01-28 DIAGNOSIS — R338 Other retention of urine: Secondary | ICD-10-CM

## 2024-01-28 LAB — BLADDER SCAN AMB NON-IMAGING: Scan Result: 90

## 2024-01-28 MED ORDER — NITROFURANTOIN MONOHYD MACRO 100 MG PO CAPS: 100.0000 mg | ORAL_CAPSULE | Freq: Once | ORAL | Status: DC

## 2024-01-28 MED ORDER — SULFAMETHOXAZOLE-TRIMETHOPRIM 800-160 MG PO TABS: 1.0000 | ORAL_TABLET | Freq: Once | ORAL | Status: AC

## 2024-01-28 NOTE — Progress Notes (Signed)
 01/28/2024 3:35 PM   KEILI HASTEN 08-Jan-1941 [id number]  CC: Chief Complaint  Patient presents with   Urinary Retention   HPI: Shelly Parks is a 83 y.o. female with PMH diabetes and a recent history of urinary retention in the setting of generalized weakness and AMS who developed gross hematuria following Foley catheter placement who presents today for voiding trial.  She is accompanied today by staff from [facility].  Catheter removed in the morning, see separate note.  She returned for PM PVR.  She has voided without difficulty or dysuria.  PVR 90 mL.  PMH: Past Medical History:  Diagnosis Date   Anemia    Asthma    COPD (chronic obstructive pulmonary disease) (HCC)    Diabetes mellitus without complication (HCC)    HLD (hyperlipidemia)    HTN (hypertension), benign    Incontinence of urine in female    Major depressive disorder, single episode, unspecified    Reflux     Surgical History: Past Surgical History:  Procedure Laterality Date   BLADDER SURGERY  1969   BREAST BIOPSY Right    long ago pt ? date   BREAST SURGERY  1973    Home Medications:  Allergies as of 01/28/2024   No Known Allergies      Medication List        Accurate as of January 28, 2024  3:35 PM. If you have any questions, ask your nurse or doctor.          amLODipine  10 MG tablet Commonly known as: NORVASC  Take 10 mg by mouth daily.   ascorbic acid  500 MG tablet Commonly known as: VITAMIN C  Take 1 tablet (500 mg total) by mouth daily.   aspirin  EC 81 MG tablet Take 1 tablet (81 mg total) by mouth daily. Swallow whole.   feeding supplement Liqd Take 237 mLs by mouth 3 (three) times daily between meals.   hydrochlorothiazide  25 MG tablet Commonly known as: HYDRODIURIL  Take 25 mg by mouth every morning.   Iron  (Ferrous Sulfate ) 325 (65 Fe) MG Tabs Take 1 tablet by mouth daily.   lisinopril  40 MG tablet Commonly known as: ZESTRIL  Take 40 mg by mouth daily.   Mag-Oxide 200  MG Tabs Generic drug: Magnesium  Oxide -Mg Supplement Take 1 tablet (200 mg total) by mouth 3 (three) times daily.   Vitamin D  (Ergocalciferol ) 1.25 MG (50000 UNIT) Caps capsule Commonly known as: DRISDOL  Take 1 capsule (50,000 Units total) by mouth every 7 (seven) days.   Wixela Inhub 100-50 MCG/ACT Aepb Generic drug: fluticasone -salmeterol Inhale 1 puff into the lungs.  Inhale 1 inhalation into the lungs every 12 (twelve) hours        Allergies:  No Known Allergies  Family History: Family History  Problem Relation Age of Onset   Cancer Father    Hypertension Mother    Cancer Brother    Hypertension Sister    Bladder Cancer Sister     Social History:   reports that she quit smoking about 53 years ago. Her smoking use included cigarettes. She has never used smokeless tobacco. She reports that she does not drink alcohol and does not use drugs.  Physical Exam: BP 105/66   Pulse 79   Constitutional:  Alert, no acute distress, nontoxic appearing HEENT: [place], AT Cardiovascular: No clubbing, cyanosis, or edema Respiratory: Normal respiratory effort, no increased work of breathing Skin: No rashes, bruises or suspicious lesions Neurologic: Grossly intact, no focal deficits, moving all  4 extremities Psychiatric: Normal mood and affect  Laboratory Data: Results for orders placed or performed in visit on 01/28/24  Bladder Scan (Post Void Residual) in office   Collection Time: 01/28/24  2:53 PM  Result Value Ref Range   Scan Result 90    Assessment & Plan:   1. Urinary retention (Primary) Voiding trial passed.  I gave her a dose of Bactrim  DS before she left this afternoon for UTI prevention. - Bladder Scan (Post Void Residual) in office - sulfamethoxazole -trimethoprim  (BACTRIM  DS) 800-160 MG per tablet 1 tablet  2. Gross hematuria I recommended cystoscopy with Dr. Twylla to rule out underlying bladder pathology and she agreed.  Return in about 2 weeks (around 02/11/2024)  for Cysto with Dr. Twylla.  Lucie Hones, PA-C  [facility] [address] [place], [place] [address] [phone]

## 2024-01-28 NOTE — Patient Instructions (Signed)
 You were given 1 tablet of Bactrim  DS for coverage of any potential bacteria. We will get you scheduled for a Cystogram with Dr Twylla.   Please call and ask to speak with a nurse if you develop questions or concerns.

## 2024-01-28 NOTE — Progress Notes (Unsigned)
 Catheter Removal  Patient is present today for a catheter removal.  19ml of water was drained from the balloon. A 14FR foley cath was removed from the bladder, no complications were noted. Patient tolerated well.  Performed by: Laymon Ned, CMA  Follow up/ Additional notes: return this afternoon at 3:20pm

## 2024-02-10 ENCOUNTER — Ambulatory Visit: Admitting: Urology

## 2024-02-10 ENCOUNTER — Encounter: Payer: Self-pay | Admitting: Urology

## 2024-02-10 VITALS — BP 118/72 | HR 103 | Ht 64.0 in | Wt 110.1 lb

## 2024-02-10 DIAGNOSIS — R310 Gross hematuria: Secondary | ICD-10-CM | POA: Diagnosis not present

## 2024-02-10 LAB — URINALYSIS, COMPLETE
Bilirubin, UA: NEGATIVE
Glucose, UA: NEGATIVE
Ketones, UA: NEGATIVE
Leukocytes,UA: NEGATIVE
Nitrite, UA: NEGATIVE
RBC, UA: NEGATIVE
Specific Gravity, UA: 1.01 (ref 1.005–1.030)
Urobilinogen, Ur: 0.2 mg/dL (ref 0.2–1.0)
pH, UA: 7 (ref 5.0–7.5)

## 2024-02-10 LAB — MICROSCOPIC EXAMINATION
Epithelial Cells (non renal): >10 /HPF — AB (ref 0–10)
RBC, Urine: NONE SEEN /HPF (ref 0–2)

## 2024-02-10 NOTE — Progress Notes (Addendum)
   02/10/24  CC: No chief complaint on file.   HPI: Refer to my inpatient consult note 01/18/2024 and Sam Vaillancourt's office note 01/28/2024.  Denies recurrent gross hematuria UA today negative RBCs.  Refer to rooming tab for vitals NED. A&Ox3.   No respiratory distress   Abd soft, NT, ND Normal external genitalia with patent urethral meatus  Cystoscopy Procedure Note  Patient identification was confirmed, informed consent was obtained, and patient was prepped using Betadine solution.  Lidocaine  jelly was administered per urethral meatus.    Procedure: - Flexible cystoscope introduced, without any difficulty.   - Thorough search of the bladder revealed:    normal urethral meatus    normal urothelium    no stones    no ulcers     no tumors    no urethral polyps    no trabeculation  - Ureteral orifices were normal in position and appearance.  Post-Procedure: - Patient tolerated the procedure well  Assessment/ Plan: No abnormalities noted on cystoscopy Follow-up as needed for recurrent gross hematuria or voiding complaints Upper tract imaging was not performed during hospitalization.  Will schedule CT urogram.    Glendia JAYSON Barba, MD

## 2024-02-16 ENCOUNTER — Encounter: Payer: Self-pay | Admitting: *Deleted

## 2024-02-16 NOTE — Telephone Encounter (Signed)
 Tried to call daughter Amy, No voice mail . Sent my chart message .

## 2024-02-16 NOTE — Telephone Encounter (Signed)
-----   Message from Glendia BROCKS [facility] sent at 02/15/2024  9:23 AM EDT ----- Regarding: CT Please let patient's daughter know I put in an order for a CT hematuria study.  I thought she had 1 done when she was the hospital.  Radiology will call to schedule an we will contact her with the results.  Thank

## 2024-03-12 ENCOUNTER — Telehealth: Payer: Self-pay | Admitting: Urology

## 2024-03-12 NOTE — Telephone Encounter (Signed)
 CT hematuria study was never scheduled; pls check on status

## 2024-03-14 NOTE — Telephone Encounter (Signed)
 Left message on voice mail cell to return the call. Ct was ordered and need to be scheduled [phone]

## 2024-07-03 ENCOUNTER — Telehealth: Payer: Self-pay | Admitting: Urology

## 2024-07-03 NOTE — Telephone Encounter (Signed)
 CT hematuria study still not scheduled.  Please reach out to patient or DPR contacts

## 2024-07-04 NOTE — Telephone Encounter (Signed)
 Reached out and left a message on granddaughter voice mail . Patient did not answer.

## 2024-07-05 NOTE — Telephone Encounter (Signed)
 Looks like they read the message her Ct scan is now scheduled

## 2024-07-13 ENCOUNTER — Ambulatory Visit
Admission: RE | Admit: 2024-07-13 | Discharge: 2024-07-13 | Disposition: A | Discharge status: Home / Self Care | Source: Ambulatory Visit | Attending: Urology | Admitting: Urology

## 2024-07-13 DIAGNOSIS — R310 Gross hematuria: Secondary | ICD-10-CM | POA: Diagnosis present

## 2024-07-13 MED ORDER — IOHEXOL 300 MG/ML  SOLN
100.0000 mL | Freq: Once | INTRAMUSCULAR | Status: AC | PRN
  Administered 2024-07-13: 100 mL via INTRAVENOUS

## 2024-07-15 ENCOUNTER — Ambulatory Visit: Payer: Self-pay | Admitting: Urology
